# Patient Record
Sex: Male | Born: 1935 | Race: White | Hispanic: No | Marital: Married | Smoking: Never smoker
Health system: Southern US, Community
[De-identification: ages and names within clinical notes are randomized; demographics above are authoritative.]

## PROBLEM LIST (undated history)

## (undated) DIAGNOSIS — I10 Essential (primary) hypertension: Secondary | ICD-10-CM

## (undated) DIAGNOSIS — I4891 Unspecified atrial fibrillation: Secondary | ICD-10-CM

## (undated) DIAGNOSIS — F0390 Unspecified dementia without behavioral disturbance: Secondary | ICD-10-CM

## (undated) DIAGNOSIS — E785 Hyperlipidemia, unspecified: Secondary | ICD-10-CM

---

## 2010-10-29 ENCOUNTER — Ambulatory Visit (HOSPITAL_COMMUNITY)
Admission: RE | Admit: 2010-10-29 | Discharge: 2010-10-29 | Disposition: A | Discharge status: Home / Self Care | Payer: Medicare Other | Source: Ambulatory Visit | Attending: Family Medicine | Admitting: Family Medicine

## 2010-10-29 DIAGNOSIS — M79609 Pain in unspecified limb: Secondary | ICD-10-CM

## 2010-10-29 DIAGNOSIS — I4891 Unspecified atrial fibrillation: Secondary | ICD-10-CM | POA: Insufficient documentation

## 2010-10-29 DIAGNOSIS — M6281 Muscle weakness (generalized): Secondary | ICD-10-CM | POA: Insufficient documentation

## 2011-01-28 ENCOUNTER — Emergency Department (HOSPITAL_COMMUNITY)
Admission: EM | Admit: 2011-01-28 | Discharge: 2011-01-28 | Disposition: A | Discharge status: Home / Self Care | Payer: Medicare Other | Attending: Emergency Medicine | Admitting: Emergency Medicine

## 2011-01-28 ENCOUNTER — Emergency Department (HOSPITAL_COMMUNITY): Payer: Medicare Other

## 2011-01-28 DIAGNOSIS — Z79899 Other long term (current) drug therapy: Secondary | ICD-10-CM | POA: Insufficient documentation

## 2011-01-28 DIAGNOSIS — I2510 Atherosclerotic heart disease of native coronary artery without angina pectoris: Secondary | ICD-10-CM | POA: Insufficient documentation

## 2011-01-28 DIAGNOSIS — M25473 Effusion, unspecified ankle: Secondary | ICD-10-CM | POA: Insufficient documentation

## 2011-01-28 DIAGNOSIS — S9000XA Contusion of unspecified ankle, initial encounter: Secondary | ICD-10-CM | POA: Insufficient documentation

## 2011-01-28 DIAGNOSIS — M25476 Effusion, unspecified foot: Secondary | ICD-10-CM | POA: Insufficient documentation

## 2011-01-28 DIAGNOSIS — S8990XA Unspecified injury of unspecified lower leg, initial encounter: Secondary | ICD-10-CM | POA: Insufficient documentation

## 2011-01-28 DIAGNOSIS — E7800 Pure hypercholesterolemia, unspecified: Secondary | ICD-10-CM | POA: Insufficient documentation

## 2011-01-28 DIAGNOSIS — S8263XA Displaced fracture of lateral malleolus of unspecified fibula, initial encounter for closed fracture: Secondary | ICD-10-CM | POA: Insufficient documentation

## 2011-01-28 DIAGNOSIS — I10 Essential (primary) hypertension: Secondary | ICD-10-CM | POA: Insufficient documentation

## 2011-01-28 DIAGNOSIS — Z7901 Long term (current) use of anticoagulants: Secondary | ICD-10-CM | POA: Insufficient documentation

## 2011-01-28 DIAGNOSIS — M25579 Pain in unspecified ankle and joints of unspecified foot: Secondary | ICD-10-CM | POA: Insufficient documentation

## 2011-01-28 DIAGNOSIS — W010XXA Fall on same level from slipping, tripping and stumbling without subsequent striking against object, initial encounter: Secondary | ICD-10-CM | POA: Insufficient documentation

## 2016-01-13 ENCOUNTER — Emergency Department (HOSPITAL_COMMUNITY): Payer: Medicare Other

## 2016-01-13 ENCOUNTER — Inpatient Hospital Stay (HOSPITAL_COMMUNITY)
Admission: EM | Admit: 2016-01-13 | Discharge: 2016-01-17 | DRG: 392 | Disposition: A | Discharge status: Skilled Nursing Facility | Payer: Medicare Other | Attending: Internal Medicine | Admitting: Internal Medicine

## 2016-01-13 ENCOUNTER — Encounter (HOSPITAL_COMMUNITY): Payer: Self-pay | Admitting: Emergency Medicine

## 2016-01-13 DIAGNOSIS — R945 Abnormal results of liver function studies: Secondary | ICD-10-CM

## 2016-01-13 DIAGNOSIS — R000 Tachycardia, unspecified: Secondary | ICD-10-CM | POA: Diagnosis present

## 2016-01-13 DIAGNOSIS — R7401 Elevation of levels of liver transaminase levels: Secondary | ICD-10-CM | POA: Diagnosis present

## 2016-01-13 DIAGNOSIS — R112 Nausea with vomiting, unspecified: Secondary | ICD-10-CM | POA: Diagnosis not present

## 2016-01-13 DIAGNOSIS — E876 Hypokalemia: Secondary | ICD-10-CM | POA: Diagnosis not present

## 2016-01-13 DIAGNOSIS — R197 Diarrhea, unspecified: Secondary | ICD-10-CM | POA: Diagnosis present

## 2016-01-13 DIAGNOSIS — D72829 Elevated white blood cell count, unspecified: Secondary | ICD-10-CM | POA: Diagnosis present

## 2016-01-13 DIAGNOSIS — F0390 Unspecified dementia without behavioral disturbance: Secondary | ICD-10-CM | POA: Diagnosis present

## 2016-01-13 DIAGNOSIS — R7989 Other specified abnormal findings of blood chemistry: Secondary | ICD-10-CM

## 2016-01-13 DIAGNOSIS — Z79899 Other long term (current) drug therapy: Secondary | ICD-10-CM

## 2016-01-13 DIAGNOSIS — K8051 Calculus of bile duct without cholangitis or cholecystitis with obstruction: Secondary | ICD-10-CM | POA: Diagnosis present

## 2016-01-13 DIAGNOSIS — R740 Nonspecific elevation of levels of transaminase and lactic acid dehydrogenase [LDH]: Secondary | ICD-10-CM | POA: Diagnosis present

## 2016-01-13 HISTORY — DX: Hyperlipidemia, unspecified: E78.5

## 2016-01-13 HISTORY — DX: Essential (primary) hypertension: I10

## 2016-01-13 HISTORY — DX: Unspecified atrial fibrillation: I48.91

## 2016-01-13 HISTORY — DX: Unspecified dementia, unspecified severity, without behavioral disturbance, psychotic disturbance, mood disturbance, and anxiety: F03.90

## 2016-01-13 LAB — CBC WITH DIFFERENTIAL/PLATELET
BASOS ABS: 0 10*3/uL (ref 0.0–0.1)
BASOS PCT: 0 %
EOS ABS: 0 10*3/uL (ref 0.0–0.7)
Eosinophils Relative: 0 %
HCT: 36 % — ABNORMAL LOW (ref 39.0–52.0)
Hemoglobin: 12.8 g/dL — ABNORMAL LOW (ref 13.0–17.0)
Lymphocytes Relative: 4 %
Lymphs Abs: 0.6 10*3/uL — ABNORMAL LOW (ref 0.7–4.0)
MCH: 34.1 pg — ABNORMAL HIGH (ref 26.0–34.0)
MCHC: 35.6 g/dL (ref 30.0–36.0)
MCV: 96 fL (ref 78.0–100.0)
MONO ABS: 1.5 10*3/uL — AB (ref 0.1–1.0)
MONOS PCT: 9 %
Neutro Abs: 16 10*3/uL — ABNORMAL HIGH (ref 1.7–7.7)
Neutrophils Relative %: 87 %
PLATELETS: 165 10*3/uL (ref 150–400)
RBC: 3.75 MIL/uL — ABNORMAL LOW (ref 4.22–5.81)
RDW: 13.4 % (ref 11.5–15.5)
WBC: 18.1 10*3/uL — ABNORMAL HIGH (ref 4.0–10.5)

## 2016-01-13 LAB — COMPREHENSIVE METABOLIC PANEL
ALBUMIN: 3.6 g/dL (ref 3.5–5.0)
ALT: 509 U/L — ABNORMAL HIGH (ref 17–63)
ANION GAP: 8 (ref 5–15)
AST: 503 U/L — ABNORMAL HIGH (ref 15–41)
Alkaline Phosphatase: 114 U/L (ref 38–126)
BUN: 20 mg/dL (ref 6–20)
CALCIUM: 9 mg/dL (ref 8.9–10.3)
CHLORIDE: 107 mmol/L (ref 101–111)
CO2: 25 mmol/L (ref 22–32)
Creatinine, Ser: 0.87 mg/dL (ref 0.61–1.24)
GFR calc Af Amer: >60 mL/min (ref >60)
GFR calc non Af Amer: >60 mL/min (ref >60)
GLUCOSE: 134 mg/dL — AB (ref 65–99)
POTASSIUM: 3.6 mmol/L (ref 3.5–5.1)
SODIUM: 140 mmol/L (ref 135–145)
Total Bilirubin: 4.7 mg/dL — ABNORMAL HIGH (ref 0.3–1.2)
Total Protein: 7.4 g/dL (ref 6.5–8.1)

## 2016-01-13 LAB — URINALYSIS, ROUTINE W REFLEX MICROSCOPIC
Glucose, UA: NEGATIVE mg/dL
HGB URINE DIPSTICK: NEGATIVE
Ketones, ur: NEGATIVE mg/dL
Nitrite: NEGATIVE
PROTEIN: 30 mg/dL — AB
Specific Gravity, Urine: 1.021 (ref 1.005–1.030)
pH: 6.5 (ref 5.0–8.0)

## 2016-01-13 LAB — LIPASE, BLOOD: LIPASE: 47 U/L (ref 11–51)

## 2016-01-13 LAB — URINE MICROSCOPIC-ADD ON
Bacteria, UA: NONE SEEN
Squamous Epithelial / LPF: NONE SEEN

## 2016-01-13 LAB — I-STAT CG4 LACTIC ACID, ED: LACTIC ACID, VENOUS: 1.83 mmol/L (ref 0.5–1.9)

## 2016-01-13 MED ORDER — DIATRIZOATE MEGLUMINE & SODIUM 66-10 % PO SOLN
15.0000 mL | Freq: Once | ORAL | Status: AC
  Administered 2016-01-13: 30 mL via ORAL

## 2016-01-13 MED ORDER — ONDANSETRON HCL 4 MG/2ML IJ SOLN: 4.0000 mg | Freq: Once | INTRAMUSCULAR | Status: DC

## 2016-01-13 MED ORDER — SODIUM CHLORIDE 0.9 % IV BOLUS (SEPSIS)
1000.0000 mL | Freq: Once | INTRAVENOUS | Status: AC
  Administered 2016-01-13: 1000 mL via INTRAVENOUS

## 2016-01-13 NOTE — ED Provider Notes (Signed)
[facility]-EMERGENCY DEPT Provider Note   CSN: [id number] Arrival date & time: 01/13/16  2042  By signing my name below, I, Rosario AdieWilliam Andrew Hiatt, attest that this documentation has been prepared under the direction and in the presence of TRW AutomotiveKelly Dellamae Rosamilia, PA-C.  Electronically Signed: Rosario AdieWilliam Andrew Hiatt, ED Scribe. 01/13/16. 9:32 PM.  History   Chief Complaint Chief Complaint  Patient presents with  . Nausea  . Emesis   The history is provided by the patient and the EMS personnel. The history is limited by the condition of the patient. No language interpreter was used.   LEVEL 5 CAVEAT: HPI and ROS limited due to dementia.  HPI Comments: Philip Morse is a 82 y.o. male BIB EMS from an independent living facility, with a PMHx of dementia, who presents to the Emergency Department complaining of intermittent episodes of nausea with associated emesis onset this AM prior to arrival. Pt denies nausea while in the ED. He reports associated abdominal distension yesterday. Denies current abdominal pain, chest pain, or nausea.  Past Medical History:  Diagnosis Date  . Dementia    There are no active problems to display for this patient.  History reviewed. No pertinent surgical history.  Home Medications    Prior to Admission medications   Medication Sig Start Date End Date Taking? Authorizing Provider  atorvastatin (LIPITOR) 40 MG tablet Take 40 mg by mouth daily.   Yes Historical Provider, MD  hydrochlorothiazide (MICROZIDE) 12.5 MG capsule Take 12.5 mg by mouth daily.   Yes Historical Provider, MD  levothyroxine (SYNTHROID, LEVOTHROID) 112 MCG tablet Take 112 mcg by mouth daily before breakfast.   Yes Historical Provider, MD  memantine (NAMENDA) 10 MG tablet Take 10 mg by mouth 2 (two) times daily.   Yes Historical Provider, MD  metoprolol succinate (TOPROL-XL) 50 MG 24 hr tablet Take 50 mg by mouth daily. Take with or immediately following a meal.   Yes Historical Provider, MD  rivaroxaban  (XARELTO) 20 MG TABS tablet Take 20 mg by mouth daily with supper.   Yes Historical Provider, MD   Family History History reviewed. No pertinent family history.  Social History Social History  Substance Use Topics  . Smoking status: Never Smoker  . Smokeless tobacco: Never Used  . Alcohol use No   Allergies   Review of patient's allergies indicates no known allergies.  Review of Systems Review of Systems  Unable to perform ROS: Dementia  Cardiovascular: Negative for chest pain.  Gastrointestinal: Positive for abdominal distention, nausea and vomiting. Negative for abdominal pain.    Physical Exam Updated Vital Signs BP 110/93   Pulse 106   Temp 98.7 F (37.1 C) (Oral)   Resp 21   Ht 6' (1.829 m)   Wt 99.8 kg   SpO2 98%   BMI 29.84 kg/m   Physical Exam  Constitutional: He is oriented to person, place, and time. He appears well-developed and well-nourished. No distress.  Nontoxic appearing and in NAD  HENT:  Head: Normocephalic and atraumatic.  Eyes: Conjunctivae and EOM are normal. No scleral icterus.  Neck: Normal range of motion.  Cardiovascular: Regular rhythm and intact distal pulses.   Tachycardia  Pulmonary/Chest: Effort normal. No respiratory distress. He has no wheezes. He has no rales.  Respirations even and unlabored. Lungs CTAB.  Abdominal: Soft. He exhibits no mass. There is no guarding.  Soft abdomen with mild TTP in RLQ and LLQ. No masses. No peritoneal signs. No rigidity.  Musculoskeletal: Normal range of motion.  Neurological: He is alert and oriented to person, place, and time.  GCS 15. Patient moving all extremities.  Skin: Skin is warm and dry. No rash noted. He is not diaphoretic. No erythema. No pallor.  Psychiatric: He has a normal mood and affect. His behavior is normal.  Nursing note and vitals reviewed.  ED Treatments / Results  DIAGNOSTIC STUDIES: Oxygen Saturation is 92% on RA, low by my interpretation.   COORDINATION OF  CARE: 9:31 PM-Discussed next steps with pt. Pt verbalized understanding and is agreeable with the plan.   Labs (all labs ordered are listed, but only abnormal results are displayed) Labs Reviewed  CBC WITH DIFFERENTIAL/PLATELET - Abnormal; Notable for the following:       Result Value   WBC 18.1 (*)    RBC 3.75 (*)    Hemoglobin 12.8 (*)    HCT 36.0 (*)    MCH 34.1 (*)    Neutro Abs 16.0 (*)    Lymphs Abs 0.6 (*)    Monocytes Absolute 1.5 (*)    All other components within normal limits  COMPREHENSIVE METABOLIC PANEL - Abnormal; Notable for the following:    Glucose, Bld 134 (*)    AST 503 (*)    ALT 509 (*)    Total Bilirubin 4.7 (*)    All other components within normal limits  URINALYSIS, ROUTINE W REFLEX MICROSCOPIC (NOT AT [facility]) - Abnormal; Notable for the following:    Color, Urine ORANGE (*)    Bilirubin Urine MODERATE (*)    Protein, ur 30 (*)    Leukocytes, UA TRACE (*)    All other components within normal limits  LIPASE, BLOOD  URINE MICROSCOPIC-ADD ON  I-STAT CG4 LACTIC ACID, ED    EKG  EKG Interpretation None      Radiology Ct Abdomen Pelvis W Contrast  Addendum Date: 01/14/2016   ADDENDUM REPORT: 01/14/2016 01:09 ADDENDUM: 17 mm left lower lobe subpleural nodule. Consider one of the following in 3 months for both low-risk and high-risk individuals: (a) repeat chest CT, (b) follow-up PET-CT, or (c) tissue sampling. This recommendation follows the consensus statement: Guidelines for Management of Incidental Pulmonary Nodules Detected on CT Images:From the Fleischner Society 2017; published online before print (10.1148/[id number]) [id number]). Electronically Signed   By: Mitzi HansenLance  Furusawa-Stratton M.D.   On: 01/14/2016 01:09   Result Date: 01/14/2016 CLINICAL DATA:  82 y/o M; abdominal distention with nausea and vomiting for 1 day. Leukocytosis. EXAM: CT ABDOMEN AND PELVIS WITH CONTRAST TECHNIQUE: Multidetector CT imaging of the abdomen and pelvis was performed using  the standard protocol following bolus administration of intravenous contrast. CONTRAST:  100 cc Isovue-300. COMPARISON:  Abdominal ultrasound dated 01/13/2016. FINDINGS: Lower chest: 17 mm left lung base subpleural nodule (series 5, image 34). Small hiatal hernia. Hepatobiliary: No mass. Segment 7/8 subcentimeter lucency is probably a cyst. Probable steatosis. No biliary ductal dilatation. Pancreas: No mass, inflammatory changes, or other significant abnormality. Spleen: Within normal limits in size and appearance. Adrenals/Urinary Tract: Bilateral kidney cysts the largest in the right interpolar region measuring 18 mm. Left kidney lower pole punctate density is probably a nonobstructing stone. Normal adrenal glands. No hydronephrosis. Small anterior bladder diverticulum. Stomach/Bowel: Small hiatal hernia. No evidence of bowel obstruction or inflammation. Moderate sigmoid diverticulosis without evidence of diverticulitis. Vascular/Lymphatic: No lymphadenopathy. Severe calcific atherosclerosis of the abdominal aorta and bilateral common iliac arteries. Reproductive: Moderate prostate enlargement with calcifications. Other: None. Musculoskeletal: The bones are demineralized. Extensive degenerative changes of the lumbar spine with  multilevel severe disc space narrowing from L1-L4, endplate degenerative changes, and marginal osteophytes. Osteophyte ridging results in severe canal stenosis at the L1-2 level. No acute fracture is identified. IMPRESSION: 1. No acute process of the abdomen or pelvis is identified. 2. Probable hepatic steatosis. 3. Small hiatal hernia. 4. Left kidney lower pole punctate nonobstructing stone. No hydronephrosis. 5. Moderate sigmoid diverticulosis without evidence for diverticulitis. 6. Severe calcific atherosclerosis of the abdominal aorta. 7. Extensive degenerative changes of the lumbar spine greatest at L1-2 where there is severe bony canal stenosis. Electronically Signed: By: Mitzi Hansen M.D. On: 01/14/2016 01:05   US Abdomen Limited  Result Date: 01/14/2016 CLINICAL DATA:  81 y/o M; right upper quadrant pain nausea and vomiting since this morning. EXAM: US ABDOMEN LIMITED - RIGHT UPPER QUADRANT COMPARISON:  None. FINDINGS: Gallbladder: No gallstones or wall thickening visualized. No sonographic Murphy sign noted by sonographer. Echogenic focus along the wall of gallbladder may represent a polyp or nonmobile stone measuring up to 4.8 mm. Common bile duct: Diameter: 4.1 mm. Liver: No focal lesion identified. Diffusely increased echogenicity compatible with steatosis. IMPRESSION: 1. No evidence for cholecystitis. 2. 5 mm echogenic gallbladder wall focus may represent a polyp or nonmobile stone. 3. Hepatic steatosis. Electronically Signed   By: Mitzi Hansen M.D.   On: 01/14/2016 00:28    Procedures Procedures   Medications Ordered in ED Medications  ondansetron [facility]) injection 4 mg (0 mg Intravenous Hold 01/13/16 2201)  sodium chloride 0.9 % bolus 1,000 mL (0 mLs Intravenous Stopped 01/13/16 2327)  diatrizoate meglumine-sodium (GASTROGRAFIN) 66-10 % solution 15 mL (30 mLs Oral Given 01/13/16 2227)  sodium chloride 0.9 % bolus 1,000 mL (0 mLs Intravenous Stopped 01/14/16 0018)  iopamidol (ISOVUE-300) 61 % injection 100 mL (100 mLs Intravenous Contrast Given 01/14/16 0034)    Initial Impression / Assessment and Plan / ED Course  I have reviewed the triage vital signs and the nursing notes.  Pertinent labs & imaging results that were available during my care of the patient were reviewed by me and considered in my medical decision making (see chart for details).  Clinical Course    Patient presenting for nausea vomiting. He was tachycardic on arrival which improved with IVF. He has leukocytosis and increased LFTs. TTP on exam by my attending appreciated to be in the RUQ. No hx of fevers. No jaundice. Less concerning for cholangitis at this time;  however, plan to admit for further evaluation of symptoms and lab abnormalities. Case discussed with Dr. Julian Reil of [facility] who will admit. Dr. Corlis Leak to discuss with GI.   Final Clinical Impressions(s) / ED Diagnoses   Final diagnoses:  Non-intractable vomiting with nausea, vomiting of unspecified type  Elevated LFTs  Leukocytosis    New Prescriptions New Prescriptions   No medications on file    I personally performed the services described in this documentation, which was scribed in my presence. The recorded information has been reviewed and is accurate.       Antony Madura, PA-C 01/14/16 1610    Courteney Randall An, MD 01/14/16 [phone]

## 2016-01-13 NOTE — ED Notes (Signed)
Patient and family was told that they are waiting on their CT scan.

## 2016-01-13 NOTE — ED Notes (Signed)
Bed: [id number] Expected date:  Expected time:  Means of arrival:  Comments: Fever, sinus tach

## 2016-01-13 NOTE — ED Triage Notes (Addendum)
Patient complaining of nausea and vomiting that started this morning. Per EMS patient has positive Orthostatics. Patient stood up and was dizzy. Patient got 500 ml of NS. On 2L [place] for an O2 Sat of 92%. Patient came from Independent living off of [address]

## 2016-01-13 NOTE — Progress Notes (Signed)
EDCM consulted to assist in finding where patient lives. EDCM googled [address] and found [facility] [phone].   EDCM called [facility] and spoke to  CarMaxKatie RN, Charity fundraiserN from skilled section of facility, who reports patient is from their Independent apartment section. Katie provided patient's wife's phone number as [phone].   Katie could not tell EDCM who patient's pcp is. Katie reports she believes the patient is able to ambulate with a walker. She doesn't know the patient to be confused. Katie reports the coordinator for the Independent Living section is Arletha PiliMary Hopper [phone].  She reports Ms. Alwyn RenHopper likes to be called if anything happens to any of the residents.   No further EDCM needs at this time.

## 2016-01-14 ENCOUNTER — Encounter (HOSPITAL_COMMUNITY): Payer: Self-pay | Admitting: Radiology

## 2016-01-14 DIAGNOSIS — R7401 Elevation of levels of liver transaminase levels: Secondary | ICD-10-CM | POA: Diagnosis present

## 2016-01-14 DIAGNOSIS — R740 Nonspecific elevation of levels of transaminase and lactic acid dehydrogenase [LDH]: Secondary | ICD-10-CM | POA: Diagnosis present

## 2016-01-14 DIAGNOSIS — E876 Hypokalemia: Secondary | ICD-10-CM | POA: Diagnosis not present

## 2016-01-14 DIAGNOSIS — R197 Diarrhea, unspecified: Secondary | ICD-10-CM | POA: Diagnosis present

## 2016-01-14 DIAGNOSIS — R000 Tachycardia, unspecified: Secondary | ICD-10-CM | POA: Diagnosis present

## 2016-01-14 DIAGNOSIS — Z79899 Other long term (current) drug therapy: Secondary | ICD-10-CM | POA: Diagnosis not present

## 2016-01-14 DIAGNOSIS — F0390 Unspecified dementia without behavioral disturbance: Secondary | ICD-10-CM | POA: Diagnosis present

## 2016-01-14 DIAGNOSIS — R112 Nausea with vomiting, unspecified: Secondary | ICD-10-CM | POA: Diagnosis present

## 2016-01-14 DIAGNOSIS — K8051 Calculus of bile duct without cholangitis or cholecystitis with obstruction: Secondary | ICD-10-CM | POA: Diagnosis present

## 2016-01-14 DIAGNOSIS — D72829 Elevated white blood cell count, unspecified: Secondary | ICD-10-CM | POA: Diagnosis present

## 2016-01-14 LAB — COMPREHENSIVE METABOLIC PANEL
ALT: 375 U/L — AB (ref 17–63)
AST: 323 U/L — AB (ref 15–41)
Albumin: 3.2 g/dL — ABNORMAL LOW (ref 3.5–5.0)
Alkaline Phosphatase: 100 U/L (ref 38–126)
Anion gap: 5 (ref 5–15)
BILIRUBIN TOTAL: 4.6 mg/dL — AB (ref 0.3–1.2)
BUN: 16 mg/dL (ref 6–20)
CALCIUM: 8.3 mg/dL — AB (ref 8.9–10.3)
CO2: 25 mmol/L (ref 22–32)
CREATININE: 0.79 mg/dL (ref 0.61–1.24)
Chloride: 108 mmol/L (ref 101–111)
GFR calc Af Amer: >60 mL/min (ref >60)
Glucose, Bld: 104 mg/dL — ABNORMAL HIGH (ref 65–99)
Potassium: 3.4 mmol/L — ABNORMAL LOW (ref 3.5–5.1)
Sodium: 138 mmol/L (ref 135–145)
Total Protein: 6.6 g/dL (ref 6.5–8.1)

## 2016-01-14 LAB — CBC
HEMATOCRIT: 32.6 % — AB (ref 39.0–52.0)
Hemoglobin: 11.1 g/dL — ABNORMAL LOW (ref 13.0–17.0)
MCH: 33.7 pg (ref 26.0–34.0)
MCHC: 34 g/dL (ref 30.0–36.0)
MCV: 99.1 fL (ref 78.0–100.0)
Platelets: 158 10*3/uL (ref 150–400)
RBC: 3.29 MIL/uL — AB (ref 4.22–5.81)
RDW: 13.8 % (ref 11.5–15.5)
WBC: 12.4 10*3/uL — AB (ref 4.0–10.5)

## 2016-01-14 MED ORDER — SODIUM CHLORIDE 0.9 % IV SOLN
INTRAVENOUS | Status: DC
  Administered 2016-01-14: 06:00:00 via INTRAVENOUS
  Administered 2016-01-14: 125 mL/h via INTRAVENOUS
  Administered 2016-01-14 – 2016-01-17 (×7): via INTRAVENOUS

## 2016-01-14 MED ORDER — MEMANTINE HCL 10 MG PO TABS
10.0000 mg | ORAL_TABLET | Freq: Two times a day (BID) | ORAL | Status: DC
  Administered 2016-01-14 – 2016-01-17 (×8): 10 mg via ORAL
  Filled 2016-01-14 (×8): qty 1

## 2016-01-14 MED ORDER — IOPAMIDOL (ISOVUE-300) INJECTION 61%
100.0000 mL | Freq: Once | INTRAVENOUS | Status: AC | PRN
Start: 2016-01-14 — End: 2016-01-14
  Administered 2016-01-14: 100 mL via INTRAVENOUS

## 2016-01-14 MED ORDER — LEVOTHYROXINE SODIUM 112 MCG PO TABS
112.0000 ug | ORAL_TABLET | Freq: Every day | ORAL | Status: DC
  Administered 2016-01-14 – 2016-01-17 (×4): 112 ug via ORAL
  Filled 2016-01-14 (×6): qty 1

## 2016-01-14 MED ORDER — ATORVASTATIN CALCIUM 40 MG PO TABS
40.0000 mg | ORAL_TABLET | Freq: Every day | ORAL | Status: DC
  Administered 2016-01-14 – 2016-01-17 (×4): 40 mg via ORAL
  Filled 2016-01-14 (×4): qty 1

## 2016-01-14 MED ORDER — ONDANSETRON HCL 4 MG/2ML IJ SOLN: 4.0000 mg | Freq: Four times a day (QID) | INTRAMUSCULAR | Status: DC | PRN

## 2016-01-14 MED ORDER — SODIUM CHLORIDE 0.9 % IV SOLN
Freq: Once | INTRAVENOUS | Status: AC
  Administered 2016-01-14: 75 mL/h via INTRAVENOUS

## 2016-01-14 MED ORDER — SODIUM CHLORIDE 0.9 % IV SOLN
3.0000 g | Freq: Four times a day (QID) | INTRAVENOUS | Status: DC
  Administered 2016-01-14 – 2016-01-17 (×14): 3 g via INTRAVENOUS
  Filled 2016-01-14 (×15): qty 3

## 2016-01-14 MED ORDER — ENOXAPARIN SODIUM 40 MG/0.4ML ~~LOC~~ SOLN
40.0000 mg | SUBCUTANEOUS | Status: DC
  Administered 2016-01-14 – 2016-01-17 (×4): 40 mg via SUBCUTANEOUS
  Filled 2016-01-14 (×4): qty 0.4

## 2016-01-14 NOTE — Progress Notes (Signed)
Patient seen and evaluated earlier this AM by my associate. Please refer to H and P for details regarding assessment and plan. Liver enzymes trending down currently.  Pt has no new complaints.  Gen: Pt in nad, alert and awake CV: no cyanosis Pulm: no increased wob  Will reassess next am.  Penny PiaVEGA, Ralonda Tartt

## 2016-01-14 NOTE — Clinical Social Work Note (Signed)
Clinical Social Work Assessment  Patient Details  Name: Philip Morse MRN: [id number] Date of Birth: 11-26-1925  Date of referral:  01/14/16               Reason for consult:  Facility Placement                Permission sought to share information with:  Family Supports, Customer service manager Permission granted to share information::  Yes, Verbal Permission Granted  Name::        Agency::     Relationship::  Wife  Contact Information:   [phone]  Housing/Transportation Living arrangements for the past 2 months:  [place] of Information:  Patient, Adult Children Patient Interpreter Needed:  None Criminal Activity/Legal Involvement Pertinent to Current Situation/Hospitalization:  No - Comment as needed Significant Relationships:  Adult Children, Spouse, Community Support Lives with:  Spouse, Other (Comment) ([place]) Do you feel safe going back to the place where you live?  Yes Need for family participation in patient care:  Yes (Comment)  Care giving concerns:  LCSWA met with patient at bedside, patient recepeive and pleasant. Patient reports he is from  [facility]. Patient felt more comfortable with [place] talking with his wife or daughters "they will give you the right information." LCSWA contacted patient wife Philip Morse, wrong number listed on facesheet. LCSWA contacted patient daughter Philip Morse. She provide LCSWA with updated number. She confirms patient lives at independent living but is hopeful [facility] his ambulation improves.  She reports the patient weak and unresponsive at home  "he was not himself ", it has been difficult for his spouse to help and support him alone.  Social Worker assessment / plan: Patient and family agreeable to pending disposition to SNF. LCSWA explained to family the patient will need to be seen by physical  therapy, and the patient will need three night qualifying stay for medicare to authorize and pay for rehab.  Plan: Assist patient and family with disposition to SNF.  Employment status:  Retired Forensic scientist:  Medicare PT Recommendations:  Not assessed at this time Information / Referral to community resources:     Patient/Family's Response to care:  Agreeable.  Patient/Family's Understanding of and Emotional Response to Diagnosis, Current Treatment, and Prognosis:  "We are so thankful he got the antibiotics and and fluids he needed." Patient family is involved in patient care and supportive of patient going to rehab at this time.   Emotional Assessment Appearance:  Appears stated age Attitude/Demeanor/Rapport:    Affect (typically observed):  Pleasant, Stable, Calm Orientation:  Oriented to Self, Oriented to Place Alcohol / Substance use:  Not Applicable Psych involvement (Current and /or in the community):  No (Comment)  Discharge Needs  Concerns to be addressed:  Care Coordination Readmission within the last 30 days:    Current discharge risk:  Dependent with Mobility Barriers to Discharge:  Continued Medical Work up, [place], LCSW 01/14/2016, 3:24 PM

## 2016-01-14 NOTE — ED Notes (Signed)
Patient in CT

## 2016-01-14 NOTE — H&P (Signed)
History and Physical    Philip Morse RUE:[id number] DOB: 1925/10/26 DOA: 01/13/2016   PCP: No primary care provider on file. Chief Complaint:  Chief Complaint  Patient presents with  . Nausea  . Emesis    HPI: Philip Morse is a 82 y.o. male with medical history significant of Dementia.  Patient presents to the ED with c/o N/V/D, RUQ abd pain.  Symptoms onset in the AM yesterday.  Persistent since onset.  Nothing makes better or worse.  ED Course: CT and US unremarkable except for a gallbladder polyp vs non-mobile stone.  Leukocytosis, mild tachycardia that improves with IVF.  Review of Systems: As per HPI otherwise 10 point review of systems negative.    Past Medical History:  Diagnosis Date  . Dementia     History reviewed. No pertinent surgical history.   reports that he has never smoked. He has never used smokeless tobacco. He reports that he does not drink alcohol or use drugs.  No Known Allergies  History reviewed. No pertinent family history. No known sick contacts, remainder of family is not ill.   Prior to Admission medications   Medication Sig Start Date End Date Taking? Authorizing Provider  atorvastatin (LIPITOR) 40 MG tablet Take 40 mg by mouth daily.   Yes Historical Provider, MD  hydrochlorothiazide (MICROZIDE) 12.5 MG capsule Take 12.5 mg by mouth daily.   Yes Historical Provider, MD  levothyroxine (SYNTHROID, LEVOTHROID) 112 MCG tablet Take 112 mcg by mouth daily before breakfast.   Yes Historical Provider, MD  memantine (NAMENDA) 10 MG tablet Take 10 mg by mouth 2 (two) times daily.   Yes Historical Provider, MD  metoprolol succinate (TOPROL-XL) 50 MG 24 hr tablet Take 50 mg by mouth daily. Take with or immediately following a meal.   Yes Historical Provider, MD  rivaroxaban (XARELTO) 20 MG TABS tablet Take 20 mg by mouth daily with supper.   Yes Historical Provider, MD    Physical Exam: Vitals:   01/13/16 2330 01/13/16 2345 01/14/16 0015 01/14/16 0203    BP: 110/59 121/63 110/93 (!) 140/126  Pulse: 89 109 106 98  Resp: 21 24 21 20   Temp:      TempSrc:      SpO2: 96% (!) 88% 98% 98%  Weight:      Height:          Constitutional: NAD, calm, comfortable Eyes: PERRL, lids and conjunctivae normal ENMT: Mucous membranes are moist. Posterior pharynx clear of any exudate or lesions.Normal dentition.  Neck: normal, supple, no masses, no thyromegaly Respiratory: clear to auscultation bilaterally, no wheezing, no crackles. Normal respiratory effort. No accessory muscle use.  Cardiovascular: Regular rate and rhythm, no murmurs / rubs / gallops. No extremity edema. 2+ pedal pulses. No carotid bruits.  Abdomen: no tenderness, no masses palpated. No hepatosplenomegaly. Bowel sounds positive.  Musculoskeletal: no clubbing / cyanosis. No joint deformity upper and lower extremities. Good ROM, no contractures. Normal muscle tone.  Skin: no rashes, lesions, ulcers. No induration Neurologic: CN 2-12 grossly intact. Sensation intact, DTR normal. Strength 5/5 in all 4.  Psychiatric: Normal judgment and insight. Alert and oriented x 3. Normal mood.    Labs on Admission: I have personally reviewed following labs and imaging studies  CBC:  Recent Labs Lab 01/13/16 2140  WBC 18.1*  NEUTROABS 16.0*  HGB 12.8*  HCT 36.0*  MCV 96.0  PLT 165   Basic Metabolic Panel:  Recent Labs Lab 01/13/16 2140  NA 140  K 3.6  CL 107  CO2 25  GLUCOSE 134*  BUN 20  CREATININE 0.87  CALCIUM 9.0   GFR: Estimated Creatinine Clearance: 71.8 mL/min (by C-G formula based on SCr of 0.87 mg/dL). Liver Function Tests:  Recent Labs Lab 01/13/16 2140  AST 503*  ALT 509*  ALKPHOS 114  BILITOT 4.7*  PROT 7.4  ALBUMIN 3.6    Recent Labs Lab 01/13/16 2140  LIPASE 47   No results for input(s): AMMONIA in the last 168 hours. Coagulation Profile: No results for input(s): INR, PROTIME in the last 168 hours. Cardiac Enzymes: No results for input(s):  CKTOTAL, CKMB, CKMBINDEX, TROPONINI in the last 168 hours. BNP (last 3 results) No results for input(s): PROBNP in the last 8760 hours. HbA1C: No results for input(s): HGBA1C in the last 72 hours. CBG: No results for input(s): GLUCAP in the last 168 hours. Lipid Profile: No results for input(s): CHOL, HDL, LDLCALC, TRIG, CHOLHDL, LDLDIRECT in the last 72 hours. Thyroid Function Tests: No results for input(s): TSH, T4TOTAL, FREET4, T3FREE, THYROIDAB in the last 72 hours. Anemia Panel: No results for input(s): VITAMINB12, FOLATE, FERRITIN, TIBC, IRON, RETICCTPCT in the last 72 hours. Urine analysis:    Component Value Date/Time   COLORURINE ORANGE (A) 01/13/2016 2215   APPEARANCEUR CLEAR 01/13/2016 2215   LABSPEC 1.021 01/13/2016 2215   PHURINE 6.5 01/13/2016 2215   GLUCOSEU NEGATIVE 01/13/2016 2215   HGBUR NEGATIVE 01/13/2016 2215   BILIRUBINUR MODERATE (A) 01/13/2016 2215   KETONESUR NEGATIVE 01/13/2016 2215   PROTEINUR 30 (A) 01/13/2016 2215   NITRITE NEGATIVE 01/13/2016 2215   LEUKOCYTESUR TRACE (A) 01/13/2016 2215   Sepsis Labs: @LABRCNTIP (procalcitonin:4,lacticidven:4) )No results found for this or any previous visit (from the past 240 hour(s)).   Radiological Exams on Admission: Ct Abdomen Pelvis W Contrast  Addendum Date: 01/14/2016   ADDENDUM REPORT: 01/14/2016 01:09 ADDENDUM: 17 mm left lower lobe subpleural nodule. Consider one of the following in 3 months for both low-risk and high-risk individuals: (a) repeat chest CT, (b) follow-up PET-CT, or (c) tissue sampling. This recommendation follows the consensus statement: Guidelines for Management of Incidental Pulmonary Nodules Detected on CT Images:From the Fleischner Society 2017; published online before print (10.1148/radiol.[id number]). Electronically Signed   By: Mitzi HansenLance  Furusawa-Stratton M.D.   On: 01/14/2016 01:09   Result Date: 01/14/2016 CLINICAL DATA:  82 y/o M; abdominal distention with nausea and vomiting for 1  day. Leukocytosis. EXAM: CT ABDOMEN AND PELVIS WITH CONTRAST TECHNIQUE: Multidetector CT imaging of the abdomen and pelvis was performed using the standard protocol following bolus administration of intravenous contrast. CONTRAST:  100 cc Isovue-300. COMPARISON:  Abdominal ultrasound dated 01/13/2016. FINDINGS: Lower chest: 17 mm left lung base subpleural nodule (series 5, image 34). Small hiatal hernia. Hepatobiliary: No mass. Segment 7/8 subcentimeter lucency is probably a cyst. Probable steatosis. No biliary ductal dilatation. Pancreas: No mass, inflammatory changes, or other significant abnormality. Spleen: Within normal limits in size and appearance. Adrenals/Urinary Tract: Bilateral kidney cysts the largest in the right interpolar region measuring 18 mm. Left kidney lower pole punctate density is probably a nonobstructing stone. Normal adrenal glands. No hydronephrosis. Small anterior bladder diverticulum. Stomach/Bowel: Small hiatal hernia. No evidence of bowel obstruction or inflammation. Moderate sigmoid diverticulosis without evidence of diverticulitis. Vascular/Lymphatic: No lymphadenopathy. Severe calcific atherosclerosis of the abdominal aorta and bilateral common iliac arteries. Reproductive: Moderate prostate enlargement with calcifications. Other: None. Musculoskeletal: The bones are demineralized. Extensive degenerative changes of the lumbar spine with multilevel severe disc space narrowing from L1-L4, endplate  degenerative changes, and marginal osteophytes. Osteophyte ridging results in severe canal stenosis at the L1-2 level. No acute fracture is identified. IMPRESSION: 1. No acute process of the abdomen or pelvis is identified. 2. Probable hepatic steatosis. 3. Small hiatal hernia. 4. Left kidney lower pole punctate nonobstructing stone. No hydronephrosis. 5. Moderate sigmoid diverticulosis without evidence for diverticulitis. 6. Severe calcific atherosclerosis of the abdominal aorta. 7.  Extensive degenerative changes of the lumbar spine greatest at L1-2 where there is severe bony canal stenosis. Electronically Signed: By: Mitzi HansenLance  Furusawa-Stratton M.D. On: 01/14/2016 01:05   [place] Abdomen Limited  Result Date: 01/14/2016 CLINICAL DATA:  82 y/o M; right upper quadrant pain nausea and vomiting since this morning. EXAM: US ABDOMEN LIMITED - RIGHT UPPER QUADRANT COMPARISON:  None. FINDINGS: Gallbladder: No gallstones or wall thickening visualized. No sonographic Murphy sign noted by sonographer. Echogenic focus along the wall of gallbladder may represent a polyp or nonmobile stone measuring up to 4.8 mm. Common bile duct: Diameter: 4.1 mm. Liver: No focal lesion identified. Diffusely increased echogenicity compatible with steatosis. IMPRESSION: 1. No evidence for cholecystitis. 2. 5 mm echogenic gallbladder wall focus may represent a polyp or nonmobile stone. 3. Hepatic steatosis. Electronically Signed   By: Mitzi HansenLance  Furusawa-Stratton M.D.   On: 01/14/2016 00:28    EKG: Independently reviewed.  Assessment/Plan Principal Problem:   Transaminitis Active Problems:   Leukocytosis   Nausea vomiting and diarrhea    1. Transaminitis, N/V/D, leukocytosis - 1. Repeat CMP and CBC in AM 2. IVF 3. Empiric unasyn for now though ascending cholangitis seems less likely given negative imaging studies 4. PRN zofran   DVT prophylaxis: Lovenox Code Status: Full Family Communication: Family at bedside Consults called: None Admission status: Admit to inpatient   Hillary BowGARDNER, JARED M. DO [facility] Pager [phone] from 7PM-7AM  If 7AM-7PM, please contact the day physician for the patient [email or web address] Password [email or web address]  01/14/2016, 2:09 AM

## 2016-01-14 NOTE — Progress Notes (Signed)
Pharmacy Antibiotic Note  Philip Morse is a 82 y.o. male admitted on 01/13/2016 with Intra-abdominal infection.  Pharmacy has been consulted for Unasyn dosing.  Plan: Unasyn 3gm iv q6hr  Height: 6' (182.9 cm) Weight: 234 lb 9.1 oz (106.4 kg) IBW/kg (Calculated) : 77.6  Temp (24hrs), Avg:98.5 F (36.9 C), Min:98.2 F (36.8 C), Max:98.7 F (37.1 C)   Recent Labs Lab 01/13/16 2140 01/13/16 2149  WBC 18.1*  --   CREATININE 0.87  --   LATICACIDVEN  --  1.83    Estimated Creatinine Clearance: 74 mL/min (by C-G formula based on SCr of 0.87 mg/dL).    No Known Allergies  Antimicrobials this admission: Unasyn 8/21 >>  Dose adjustments this admission: -  Microbiology results: pending  Thank you for allowing pharmacy to be a part of this patient's care.  Philip DavidsonGrimsley Morse, Philip Morse 01/14/2016 5:42 AM

## 2016-01-14 NOTE — ED Notes (Signed)
Patient is on room air 

## 2016-01-15 DIAGNOSIS — R197 Diarrhea, unspecified: Secondary | ICD-10-CM

## 2016-01-15 DIAGNOSIS — R740 Nonspecific elevation of levels of transaminase and lactic acid dehydrogenase [LDH]: Secondary | ICD-10-CM

## 2016-01-15 DIAGNOSIS — D72829 Elevated white blood cell count, unspecified: Secondary | ICD-10-CM

## 2016-01-15 DIAGNOSIS — R112 Nausea with vomiting, unspecified: Principal | ICD-10-CM

## 2016-01-15 LAB — COMPREHENSIVE METABOLIC PANEL
ALT: 269 U/L — AB (ref 17–63)
AST: 167 U/L — AB (ref 15–41)
Albumin: 3.6 g/dL (ref 3.5–5.0)
Alkaline Phosphatase: 143 U/L — ABNORMAL HIGH (ref 38–126)
Anion gap: 7 (ref 5–15)
BUN: 12 mg/dL (ref 6–20)
CALCIUM: 8.7 mg/dL — AB (ref 8.9–10.3)
CHLORIDE: 106 mmol/L (ref 101–111)
CO2: 25 mmol/L (ref 22–32)
Creatinine, Ser: 0.82 mg/dL (ref 0.61–1.24)
GFR calc non Af Amer: >60 mL/min (ref >60)
Glucose, Bld: 98 mg/dL (ref 65–99)
POTASSIUM: 3.2 mmol/L — AB (ref 3.5–5.1)
SODIUM: 138 mmol/L (ref 135–145)
TOTAL PROTEIN: 7 g/dL (ref 6.5–8.1)
Total Bilirubin: 4.2 mg/dL — ABNORMAL HIGH (ref 0.3–1.2)

## 2016-01-15 MED ORDER — POTASSIUM CHLORIDE CRYS ER 20 MEQ PO TBCR
40.0000 meq | EXTENDED_RELEASE_TABLET | Freq: Two times a day (BID) | ORAL | Status: AC
Start: 2016-01-15 — End: 2016-01-15
  Administered 2016-01-15 (×2): 40 meq via ORAL
  Filled 2016-01-15 (×2): qty 2

## 2016-01-15 MED ORDER — LORAZEPAM 2 MG/ML IJ SOLN
1.0000 mg | Freq: Once | INTRAMUSCULAR | Status: AC
  Administered 2016-01-15: 1 mg via INTRAVENOUS
  Filled 2016-01-15: qty 1

## 2016-01-15 MED ORDER — LORAZEPAM 2 MG/ML IJ SOLN
1.0000 mg | Freq: Once | INTRAMUSCULAR | Status: AC
  Administered 2016-01-16: 1 mg via INTRAVENOUS
  Filled 2016-01-15: qty 1

## 2016-01-15 NOTE — Progress Notes (Signed)
PROGRESS NOTE    Donette LarrySamuel Janish  UJW:[id number] DOB: June 03, 1925 DOA: 01/13/2016 PCP: No primary care provider on file.  Outpatient Specialists:   Brief Narrative: 82 y.o. male with medical history significant of Dementia.  Patient presents to the ED with one day history of N/V/D, RUQ abd pain. CT and US unremarkable except for a gallbladder polyp vs non-mobile stone. Leukocytosis and mild tachycardia noted on presentation.   Assessment & Plan:   Principal Problem:   Transaminitis Active Problems:   Leukocytosis   Nausea vomiting and diarrhea  1. Transamnitis - Slowly improving.  2. N/V/D - Slowly improving 3. Leukocytosis - Improving. CBC in am. Likely DC in am if patient continues to remain stable. 4. Hypokalemia - Replete and monitor   DVT prophylaxis: Lovenox Code Status: Full Family Communication: Family at bedside Consults called: None Admission status: Admit to inpatient  Consultants:   None  Procedures:   None  Antimicrobials:   IV Unasyn   Subjective: Nil new complaints  Objective: Vitals:   01/14/16 0345 01/14/16 1543 01/14/16 2101 01/15/16 0504  BP: 133/78 130/76 138/86 (!) 143/98  Pulse: 68 (!) 106 (!) 116 (!) 115  Resp: 16  20 18   Temp: 98.2 F (36.8 C) 98.4 F (36.9 C) 98.6 F (37 C) 98.5 F (36.9 C)  TempSrc:  Oral Oral Oral  SpO2: 97% 99% 98% 98%  Weight: 106.4 kg (234 lb 9.1 oz)     Height: 6' (1.829 m)       Intake/Output Summary (Last 24 hours) at 01/15/16 1049 Last data filed at 01/14/16 1940  Gross per 24 hour  Intake             1320 ml  Output              525 ml  Net              795 ml   Filed Weights   01/13/16 2107 01/14/16 0345  Weight: 99.8 kg (220 lb) 106.4 kg (234 lb 9.1 oz)    Examination:  General exam: Appears calm and comfortable  Respiratory system: Clear to auscultation.   Cardiovascular system: S1 & S2. Gastrointestinal system: Abdomen is soft and nontender.   Central nervous system: Awake and alert.   Extremities: No leg edema.   Data Reviewed: I have personally reviewed following labs and imaging studies  CBC:  Recent Labs Lab 01/13/16 2140 01/14/16 0547  WBC 18.1* 12.4*  NEUTROABS 16.0*  --   HGB 12.8* 11.1*  HCT 36.0* 32.6*  MCV 96.0 99.1  PLT 165 158   Basic Metabolic Panel:  Recent Labs Lab 01/13/16 2140 01/14/16 0547 01/15/16 0605  NA 140 138 138  K 3.6 3.4* 3.2*  CL 107 108 106  CO2 25 25 25   GLUCOSE 134* 104* 98  BUN 20 16 12   CREATININE 0.87 0.79 0.82  CALCIUM 9.0 8.3* 8.7*   GFR: Estimated Creatinine Clearance: 78.5 mL/min (by C-G formula based on SCr of 0.82 mg/dL). Liver Function Tests:  Recent Labs Lab 01/13/16 2140 01/14/16 0547 01/15/16 0605  AST 503* 323* 167*  ALT 509* 375* 269*  ALKPHOS 114 100 143*  BILITOT 4.7* 4.6* 4.2*  PROT 7.4 6.6 7.0  ALBUMIN 3.6 3.2* 3.6    Recent Labs Lab 01/13/16 2140  LIPASE 47   No results for input(s): AMMONIA in the last 168 hours. Coagulation Profile: No results for input(s): INR, PROTIME in the last 168 hours. Cardiac Enzymes: No results for input(s):  CKTOTAL, CKMB, CKMBINDEX, TROPONINI in the last 168 hours. BNP (last 3 results) No results for input(s): PROBNP in the last 8760 hours. HbA1C: No results for input(s): HGBA1C in the last 72 hours. CBG: No results for input(s): GLUCAP in the last 168 hours. Lipid Profile: No results for input(s): CHOL, HDL, LDLCALC, TRIG, CHOLHDL, LDLDIRECT in the last 72 hours. Thyroid Function Tests: No results for input(s): TSH, T4TOTAL, FREET4, T3FREE, THYROIDAB in the last 72 hours. Anemia Panel: No results for input(s): VITAMINB12, FOLATE, FERRITIN, TIBC, IRON, RETICCTPCT in the last 72 hours. Urine analysis:    Component Value Date/Time   COLORURINE ORANGE (A) 01/13/2016 2215   APPEARANCEUR CLEAR 01/13/2016 2215   LABSPEC 1.021 01/13/2016 2215   PHURINE 6.5 01/13/2016 2215   GLUCOSEU NEGATIVE 01/13/2016 2215   HGBUR NEGATIVE 01/13/2016 2215    BILIRUBINUR MODERATE (A) 01/13/2016 2215   KETONESUR NEGATIVE 01/13/2016 2215   PROTEINUR 30 (A) 01/13/2016 2215   NITRITE NEGATIVE 01/13/2016 2215   LEUKOCYTESUR TRACE (A) 01/13/2016 2215   Sepsis Labs: @LABRCNTIP (procalcitonin:4,lacticidven:4)  )No results found for this or any previous visit (from the past 240 hour(s)).       Radiology Studies: Ct Abdomen Pelvis W Contrast  Addendum Date: 01/14/2016   ADDENDUM REPORT: 01/14/2016 01:09 ADDENDUM: 17 mm left lower lobe subpleural nodule. Consider one of the following in 3 months for both low-risk and high-risk individuals: (a) repeat chest CT, (b) follow-up PET-CT, or (c) tissue sampling. This recommendation follows the consensus statement: Guidelines for Management of Incidental Pulmonary Nodules Detected on CT Images:From the Fleischner Society 2017; published online before print (10.1148/[id number]). Electronically Signed   By: Mitzi HansenLance  Furusawa-Stratton M.D.   On: 01/14/2016 01:09   Result Date: 01/14/2016 CLINICAL DATA:  82 y/o M; abdominal distention with nausea and vomiting for 1 day. Leukocytosis. EXAM: CT ABDOMEN AND PELVIS WITH CONTRAST TECHNIQUE: Multidetector CT imaging of the abdomen and pelvis was performed using the standard protocol following bolus administration of intravenous contrast. CONTRAST:  100 cc Isovue-300. COMPARISON:  Abdominal ultrasound dated 01/13/2016. FINDINGS: Lower chest: 17 mm left lung base subpleural nodule (series 5, image 34). Small hiatal hernia. Hepatobiliary: No mass. Segment 7/8 subcentimeter lucency is probably a cyst. Probable steatosis. No biliary ductal dilatation. Pancreas: No mass, inflammatory changes, or other significant abnormality. Spleen: Within normal limits in size and appearance. Adrenals/Urinary Tract: Bilateral kidney cysts the largest in the right interpolar region measuring 18 mm. Left kidney lower pole punctate density is probably a nonobstructing stone. Normal adrenal glands.  No hydronephrosis. Small anterior bladder diverticulum. Stomach/Bowel: Small hiatal hernia. No evidence of bowel obstruction or inflammation. Moderate sigmoid diverticulosis without evidence of diverticulitis. Vascular/Lymphatic: No lymphadenopathy. Severe calcific atherosclerosis of the abdominal aorta and bilateral common iliac arteries. Reproductive: Moderate prostate enlargement with calcifications. Other: None. Musculoskeletal: The bones are demineralized. Extensive degenerative changes of the lumbar spine with multilevel severe disc space narrowing from L1-L4, endplate degenerative changes, and marginal osteophytes. Osteophyte ridging results in severe canal stenosis at the L1-2 level. No acute fracture is identified. IMPRESSION: 1. No acute process of the abdomen or pelvis is identified. 2. Probable hepatic steatosis. 3. Small hiatal hernia. 4. Left kidney lower pole punctate nonobstructing stone. No hydronephrosis. 5. Moderate sigmoid diverticulosis without evidence for diverticulitis. 6. Severe calcific atherosclerosis of the abdominal aorta. 7. Extensive degenerative changes of the lumbar spine greatest at L1-2 where there is severe bony canal stenosis. Electronically Signed: By: Mitzi HansenLance  Furusawa-Stratton M.D. On: 01/14/2016 01:05   [place] Abdomen Limited  Result Date: 01/14/2016 CLINICAL DATA:  82 y/o M; right upper quadrant pain nausea and vomiting since this morning. EXAM: US ABDOMEN LIMITED - RIGHT UPPER QUADRANT COMPARISON:  None. FINDINGS: Gallbladder: No gallstones or wall thickening visualized. No sonographic Murphy sign noted by sonographer. Echogenic focus along the wall of gallbladder may represent a polyp or nonmobile stone measuring up to 4.8 mm. Common bile duct: Diameter: 4.1 mm. Liver: No focal lesion identified. Diffusely increased echogenicity compatible with steatosis. IMPRESSION: 1. No evidence for cholecystitis. 2. 5 mm echogenic gallbladder wall focus may represent a polyp or nonmobile  stone. 3. Hepatic steatosis. Electronically Signed   By: Mitzi Hansen M.D.   On: 01/14/2016 00:28        Scheduled Meds: . ampicillin-sulbactam (UNASYN) IV  3 g Intravenous Q6H  . atorvastatin  40 mg Oral Daily  . enoxaparin (LOVENOX) injection  40 mg Subcutaneous Q24H  . levothyroxine  112 mcg Oral QAC breakfast  . memantine  10 mg Oral BID  . ondansetron (ZOFRAN) IV  4 mg Intravenous Once   Continuous Infusions: . sodium chloride 125 mL/hr at 01/15/16 0756     LOS: 1 day    Time spent: Greater than 30 minutes    Berton Mount, MD  [facility] Pager #: [phone] 7PM-7AM contact night coverage as above

## 2016-01-16 LAB — RENAL FUNCTION PANEL
Albumin: 3 g/dL — ABNORMAL LOW (ref 3.5–5.0)
Anion gap: 6 (ref 5–15)
BUN: 9 mg/dL (ref 6–20)
CO2: 22 mmol/L (ref 22–32)
Calcium: 8.2 mg/dL — ABNORMAL LOW (ref 8.9–10.3)
Chloride: 109 mmol/L (ref 101–111)
Creatinine, Ser: 0.61 mg/dL (ref 0.61–1.24)
GFR calc Af Amer: >60 mL/min (ref >60)
GFR calc non Af Amer: >60 mL/min (ref >60)
Glucose, Bld: 119 mg/dL — ABNORMAL HIGH (ref 65–99)
Phosphorus: 1.7 mg/dL — ABNORMAL LOW (ref 2.5–4.6)
Potassium: 3.5 mmol/L (ref 3.5–5.1)
Sodium: 137 mmol/L (ref 135–145)

## 2016-01-16 MED ORDER — HYDRALAZINE HCL 20 MG/ML IJ SOLN
10.0000 mg | Freq: Once | INTRAMUSCULAR | Status: AC
  Administered 2016-01-16: 10 mg via INTRAVENOUS
  Filled 2016-01-16: qty 1

## 2016-01-16 MED ORDER — GUAIFENESIN ER 600 MG PO TB12: 1200.0000 mg | ORAL_TABLET | Freq: Two times a day (BID) | ORAL | Status: DC | PRN

## 2016-01-16 NOTE — Evaluation (Signed)
Physical Therapy Evaluation Patient Details Name: Philip Morse MRN: [id number] DOB: 12/14/1925 Today's Date: 01/16/2016   History of Present Illness  Pt is an 82 year old male with hx of dementia admitted with N/V/D and diagnosed with Transaminitis  Clinical Impression  Pt admitted with above diagnosis. Pt currently with functional limitations due to the deficits listed below (see PT Problem List).  Pt will benefit from skilled PT to increase their independence and safety with mobility to allow discharge to the venue listed below.  Pt with unintelligible speech and not opening eyes despite cues.  Pt not following commands so deferred mobility.  Pt clearing throat and producing increased amounts of sputum (also just spitting into room so provided assist with tissues).  CSW reports discussion with pt and spouse yesterday and pt with better cognition.  Recommend d/c to SNF prior to return to ILF.     Follow Up Recommendations SNF;Supervision/Assistance - 24 hour    Equipment Recommendations  Other (comment) (per next venue)    Recommendations for Other Services       Precautions / Restrictions Precautions Precautions: Fall Restrictions Weight Bearing Restrictions: No      Mobility  Bed Mobility Overal bed mobility: +2 for physical assistance             General bed mobility comments: pt not following commands well with moving extremities, deferred bed mobility, vitals documentated in flowsheet with high BP (however NT states blue cuff has been giving higher readings for pt), HR in 110s at rest  Transfers                    Ambulation/Gait                Stairs            Wheelchair Mobility    Modified Rankin (Stroke Patients Only)       Balance                                             Pertinent Vitals/Pain Pain Assessment: Faces Faces Pain Scale: Hurts a little bit    Home Living   Living Arrangements:  Spouse/significant other   Type of Home: Independent living facility           Additional Comments: per CSW, pt from ILF with spouse, pt poor historian    Prior Function           Comments: uncertain however from ILF, likely ambulatory     Hand Dominance        Extremity/Trunk Assessment   Upper Extremity Assessment: Generalized weakness;Difficult to assess due to impaired cognition           Lower Extremity Assessment: Generalized weakness;Difficult to assess due to impaired cognition         Communication   Communication: Expressive difficulties (mumbled speech)  Cognition Arousal/Alertness: Lethargic   Overall Cognitive Status: No family/caregiver present to determine baseline cognitive functioning Area of Impairment: Following commands       Following Commands: Follows one step commands inconsistently       General Comments: hx of dementia, pt not opening eyes during session, attempting to answer questions however mumbled unintelligible speech    General Comments      Exercises        Assessment/Plan    PT Assessment Patient needs  continued PT services  PT Diagnosis Difficulty walking;Generalized weakness;Other (comment) (?AMS)   PT Problem List Decreased strength;Decreased activity tolerance;Decreased balance;Decreased cognition;Decreased mobility  PT Treatment Interventions DME instruction;Functional mobility training;Gait training;Therapeutic exercise;Therapeutic activities;Patient/family education   PT Goals (Current goals can be found in the Care Plan section) Acute Rehab PT Goals PT Goal Formulation: Patient unable to participate in goal setting Time For Goal Achievement: 01/30/16 Potential to Achieve Goals: Fair    Frequency Min 3X/week   Barriers to discharge        Co-evaluation               End of Session   Activity Tolerance: Patient limited by lethargy;Other (comment) (limited by cognition) Patient left: with  call bell/phone within reach;in bed;with nursing/sitter in room;with bed alarm set           Time: [id number] PT Time Calculation (min) (ACUTE ONLY): 18 min   Charges:   PT Evaluation $PT Eval Low Complexity: 1 Procedure     PT G Codes:        Lebaron Bautch,KATHrine E 01/16/2016, 11:39 AM Zenovia JarredKati Tyrese Capriotti, PT, DPT 01/16/2016 Pager: [phone]

## 2016-01-16 NOTE — [place] FL2 (Signed)
  [place] MEDICAID FL2 LEVEL OF CARE SCREENING TOOL     IDENTIFICATION  Patient Name: Philip Morse Birthdate: August 07, 1925 Sex: male Admission Date (Current Location): 01/13/2016  [facility] [place]   Facility and Address:  [facility],  [address], [place] [id number]      Provider Number: [id number]  Attending Physician Name and Address:  Barnetta ChapelSylvester I Ogbata, MD  Relative Name and Phone Number:       Current Level of Care: Hospital Recommended Level of Care: Skilled Nursing Facility Prior Approval Number:    Date Approved/Denied:   PASRR Number:    Discharge Plan: SNF    Current Diagnoses: Patient Active Problem List   Diagnosis Date Noted  . Transaminitis 01/14/2016  . Leukocytosis 01/14/2016  . Nausea vomiting and diarrhea 01/14/2016    Orientation RESPIRATION BLADDER Height & Weight     Place  Normal Incontinent Weight: 234 lb 9.1 oz (106.4 kg) Height:  6' (182.9 cm)  BEHAVIORAL SYMPTOMS/MOOD NEUROLOGICAL BOWEL NUTRITION STATUS      Incontinent Diet  AMBULATORY STATUS COMMUNICATION OF NEEDS Skin   Extensive Assist Verbally Normal                       Personal Care Assistance Level of Assistance  Bathing, Feeding, Dressing Bathing Assistance: Limited assistance Feeding assistance: Limited assistance Dressing Assistance: Limited assistance     Functional Limitations Info  Sight, Hearing, Speech Sight Info: Adequate Hearing Info: Adequate Speech Info: Adequate    SPECIAL CARE FACTORS FREQUENCY  PT (By licensed PT)     PT Frequency: 3              Contractures Contractures Info: Not present    Additional Factors Info  Code Status, Insulin Sliding Scale Code Status Info: No Known Allergies             Current Medications (01/16/2016):  This is the current hospital active medication list Current Facility-Administered Medications  Medication Dose Route Frequency Provider Last Rate Last Dose  .  0.9 %  sodium chloride infusion   Intravenous Continuous Hillary BowJared M Gardner, DO 125 mL/hr at 01/16/16 [id number]    . Ampicillin-Sulbactam (UNASYN) 3 g in sodium chloride 0.9 % 100 mL IVPB  3 g Intravenous Q6H Hillary BowJared M Gardner, DO 100 mL/hr at 01/16/16 0515 3 g at 01/16/16 0515  . atorvastatin (LIPITOR) tablet 40 mg  40 mg Oral Daily Hillary BowJared M Gardner, DO   40 mg at 01/16/16 [id number]  . enoxaparin (LOVENOX) injection 40 mg  40 mg Subcutaneous Q24H Hillary BowJared M Gardner, DO   40 mg at 01/16/16 [id number]  . levothyroxine (SYNTHROID, LEVOTHROID) tablet 112 mcg  112 mcg Oral QAC breakfast Hillary BowJared M Gardner, DO   112 mcg at 01/16/16 0957  . memantine (NAMENDA) tablet 10 mg  10 mg Oral BID Hillary BowJared M Gardner, DO   10 mg at 01/16/16 [id number]  . ondansetron (ZOFRAN) injection 4 mg  4 mg Intravenous Once Antony MaduraKelly Humes, PA-C   Stopped at 01/13/16 2201  . ondansetron (ZOFRAN) injection 4 mg  4 mg Intravenous Q6H PRN Hillary BowJared M Gardner, DO         Discharge Medications: Please see discharge summary for a list of discharge medications.  Relevant Imaging Results:  Relevant Lab Results:   Additional Information ss# [id number]  Clearance CootsNicole A Vivan Agostino, LCSW

## 2016-01-16 NOTE — Progress Notes (Signed)
PROGRESS NOTE    Philip Morse  ZOX:[id number] DOB: [birth year] DOA: 01/13/2016 PCP: No primary care provider on file.  Outpatient Specialists:   Brief Narrative: 82 y.o. male with medical history significant of Dementia.  Patient presents to the ED with one day history of N/V/D, RUQ abd pain. CT and US unremarkable except for a gallbladder polyp vs non-mobile stone. Leukocytosis and mild tachycardia noted on presentation.   Assessment & Plan:   Principal Problem:   Transaminitis Active Problems:   Leukocytosis   Nausea vomiting and diarrhea  1. Transamnitis - Slowly improving. Repeat labs in am. 2. N/V/D - Resolved. 3. Leukocytosis - Improving. CBC in am. Likely DC in am if patient continues to remain stable. 4. Hypokalemia - Replete and monitor  DVT prophylaxis: Lovenox Code Status: Full Family Communication: Family at bedside Consults called: None Admission status: Admit to inpatient  Consultants:   None  Procedures:   None  Antimicrobials:   IV Unasyn   Subjective: Nil new complaints  Objective: Vitals:   01/15/16 2116 01/16/16 0516 01/16/16 0938 01/16/16 1500  BP: (!) 167/103 (!) 158/110 (!) 156/110   Pulse: (!) 59 (!) 117 (!) 118 (!) 126  Resp: 18 (!) 24  20  Temp: 97.6 F (36.4 C) 98.4 F (36.9 C)  97.4 F (36.3 C)  TempSrc: Oral Oral  Oral  SpO2: 95% 94% 98%   Weight:      Height:        Intake/Output Summary (Last 24 hours) at 01/16/16 1652 Last data filed at 01/16/16 1300  Gross per 24 hour  Intake                0 ml  Output                0 ml  Net                0 ml   Filed Weights   01/13/16 2107 01/14/16 0345  Weight: 99.8 kg (220 lb) 106.4 kg (234 lb 9.1 oz)    Examination:  General exam: Appears calm and comfortable  Respiratory system: Clear to auscultation.   Cardiovascular system: S1 & S2. Gastrointestinal system: Abdomen is soft and nontender.   Central nervous system: Awake and alert.  Extremities: No leg edema.     Data Reviewed: I have personally reviewed following labs and imaging studies  CBC:  Recent Labs Lab 01/13/16 2140 01/14/16 0547  WBC 18.1* 12.4*  NEUTROABS 16.0*  --   HGB 12.8* 11.1*  HCT 36.0* 32.6*  MCV 96.0 99.1  PLT 165 158   Basic Metabolic Panel:  Recent Labs Lab 01/13/16 2140 01/14/16 0547 01/15/16 0605 01/16/16 0600  NA 140 138 138 137  K 3.6 3.4* 3.2* 3.5  CL 107 108 106 109  CO2 25 25 25 22   GLUCOSE 134* 104* 98 119*  BUN 20 16 12 9   CREATININE 0.87 0.79 0.82 0.61  CALCIUM 9.0 8.3* 8.7* 8.2*  PHOS  --   --   --  1.7*   GFR: Estimated Creatinine Clearance: 80.4 mL/min (by C-G formula based on SCr of 0.8 mg/dL). Liver Function Tests:  Recent Labs Lab 01/13/16 2140 01/14/16 0547 01/15/16 0605 01/16/16 0600  AST 503* 323* 167*  --   ALT 509* 375* 269*  --   ALKPHOS 114 100 143*  --   BILITOT 4.7* 4.6* 4.2*  --   PROT 7.4 6.6 7.0  --   ALBUMIN 3.6 3.2* 3.6  3.0*    Recent Labs Lab 01/13/16 2140  LIPASE 47   No results for input(s): AMMONIA in the last 168 hours. Coagulation Profile: No results for input(s): INR, PROTIME in the last 168 hours. Cardiac Enzymes: No results for input(s): CKTOTAL, CKMB, CKMBINDEX, TROPONINI in the last 168 hours. BNP (last 3 results) No results for input(s): PROBNP in the last 8760 hours. HbA1C: No results for input(s): HGBA1C in the last 72 hours. CBG: No results for input(s): GLUCAP in the last 168 hours. Lipid Profile: No results for input(s): CHOL, HDL, LDLCALC, TRIG, CHOLHDL, LDLDIRECT in the last 72 hours. Thyroid Function Tests: No results for input(s): TSH, T4TOTAL, FREET4, T3FREE, THYROIDAB in the last 72 hours. Anemia Panel: No results for input(s): VITAMINB12, FOLATE, FERRITIN, TIBC, IRON, RETICCTPCT in the last 72 hours. Urine analysis:    Component Value Date/Time   COLORURINE ORANGE (A) 01/13/2016 2215   APPEARANCEUR CLEAR 01/13/2016 2215   LABSPEC 1.021 01/13/2016 2215   PHURINE 6.5  01/13/2016 2215   GLUCOSEU NEGATIVE 01/13/2016 2215   HGBUR NEGATIVE 01/13/2016 2215   BILIRUBINUR MODERATE (A) 01/13/2016 2215   KETONESUR NEGATIVE 01/13/2016 2215   PROTEINUR 30 (A) 01/13/2016 2215   NITRITE NEGATIVE 01/13/2016 2215   LEUKOCYTESUR TRACE (A) 01/13/2016 2215   Sepsis Labs: @LABRCNTIP (procalcitonin:4,lacticidven:4)  ) Recent Results (from the past 240 hour(s))  Culture, blood (routine x 2)     Status: None (Preliminary result)   Collection Time: 01/14/16  5:45 AM  Result Value Ref Range Status   Specimen Description BLOOD LEFT ANTECUBITAL  Final   Special Requests IN PEDIATRIC BOTTLE 2CC  Final   Culture   Final    NO GROWTH 2 DAYS Performed at [facility]    Report Status PENDING  Incomplete  Culture, blood (routine x 2)     Status: None (Preliminary result)   Collection Time: 01/14/16  5:45 AM  Result Value Ref Range Status   Specimen Description BLOOD RIGHT ANTECUBITAL  Final   Special Requests IN PEDIATRIC BOTTLE 1.5CC  Final   Culture   Final    NO GROWTH 2 DAYS Performed at [facility]    Report Status PENDING  Incomplete         Radiology Studies: No results found.      Scheduled Meds: . ampicillin-sulbactam (UNASYN) IV  3 g Intravenous Q6H  . atorvastatin  40 mg Oral Daily  . enoxaparin (LOVENOX) injection  40 mg Subcutaneous Q24H  . levothyroxine  112 mcg Oral QAC breakfast  . memantine  10 mg Oral BID  . ondansetron (ZOFRAN) IV  4 mg Intravenous Once   Continuous Infusions: . sodium chloride 125 mL/hr at 01/16/16 1626     LOS: 2 days    Time spent: Greater than 30 minutes    Berton MountSylvester Deniss Wormley, MD  [facility] Pager #: [phone] 7PM-7AM contact night coverage as above

## 2016-01-17 ENCOUNTER — Encounter (HOSPITAL_COMMUNITY): Payer: Self-pay

## 2016-01-17 LAB — CBC WITH DIFFERENTIAL/PLATELET
Basophils Absolute: 0 10*3/uL (ref 0.0–0.1)
Basophils Relative: 0 %
Eosinophils Absolute: 0.1 10*3/uL (ref 0.0–0.7)
Eosinophils Relative: 2 %
HCT: 31.1 % — ABNORMAL LOW (ref 39.0–52.0)
Hemoglobin: 10.7 g/dL — ABNORMAL LOW (ref 13.0–17.0)
Lymphocytes Relative: 15 %
Lymphs Abs: 0.9 10*3/uL (ref 0.7–4.0)
MCH: 33.5 pg (ref 26.0–34.0)
MCHC: 34.4 g/dL (ref 30.0–36.0)
MCV: 97.5 fL (ref 78.0–100.0)
Monocytes Absolute: 0.4 10*3/uL (ref 0.1–1.0)
Monocytes Relative: 7 %
Neutro Abs: 4.5 10*3/uL (ref 1.7–7.7)
Neutrophils Relative %: 76 %
Platelets: 137 10*3/uL — ABNORMAL LOW (ref 150–400)
RBC: 3.19 MIL/uL — ABNORMAL LOW (ref 4.22–5.81)
RDW: 13.7 % (ref 11.5–15.5)
WBC: 6 10*3/uL (ref 4.0–10.5)

## 2016-01-17 LAB — RENAL FUNCTION PANEL
Albumin: 2.9 g/dL — ABNORMAL LOW (ref 3.5–5.0)
Anion gap: 6 (ref 5–15)
BUN: 10 mg/dL (ref 6–20)
CO2: 23 mmol/L (ref 22–32)
Calcium: 8.5 mg/dL — ABNORMAL LOW (ref 8.9–10.3)
Chloride: 111 mmol/L (ref 101–111)
Creatinine, Ser: 0.65 mg/dL (ref 0.61–1.24)
GFR calc Af Amer: >60 mL/min (ref >60)
GFR calc non Af Amer: >60 mL/min (ref >60)
Glucose, Bld: 82 mg/dL (ref 65–99)
Phosphorus: 2.3 mg/dL — ABNORMAL LOW (ref 2.5–4.6)
Potassium: 3.5 mmol/L (ref 3.5–5.1)
Sodium: 140 mmol/L (ref 135–145)

## 2016-01-17 LAB — MAGNESIUM: Magnesium: 1.7 mg/dL (ref 1.7–2.4)

## 2016-01-17 MED ORDER — AMOXICILLIN-POT CLAVULANATE 875-125 MG PO TABS: 1.0000 | ORAL_TABLET | Freq: Two times a day (BID) | ORAL | 0 refills | Status: AC

## 2016-01-17 MED ORDER — RIVAROXABAN 20 MG PO TABS
20.0000 mg | ORAL_TABLET | Freq: Every day | ORAL | Status: DC
  Administered 2016-01-17: 20 mg via ORAL
  Filled 2016-01-17: qty 1

## 2016-01-17 MED ORDER — K PHOS MONO-SOD PHOS DI & MONO 155-852-130 MG PO TABS
500.0000 mg | ORAL_TABLET | Freq: Two times a day (BID) | ORAL | Status: DC
  Administered 2016-01-17: 500 mg via ORAL
  Filled 2016-01-17: qty 2

## 2016-01-17 MED ORDER — K PHOS MONO-SOD PHOS DI & MONO 155-852-130 MG PO TABS: 500.0000 mg | ORAL_TABLET | Freq: Two times a day (BID) | ORAL | 0 refills | Status: AC

## 2016-01-17 MED ORDER — GUAIFENESIN ER 600 MG PO TB12: 600.0000 mg | ORAL_TABLET | Freq: Two times a day (BID) | ORAL | 0 refills | Status: AC | PRN

## 2016-01-17 NOTE — Progress Notes (Signed)
[facility], [phone]. [facility]

## 2016-01-17 NOTE — Care Management Note (Signed)
D/C SNF

## 2016-01-17 NOTE — Progress Notes (Signed)
Over the past 2 nights this RN has noticed the patient has had multiple episodes where he becomes agitated, extremely figity, more confused, and nonverbal except for grunts and moans. The patent becomes unable to follow commands nor be redirected. During this time the patient's breathing becomes shallow and labored, however, his oxygen saturation maintains at or above 92% and his heart rate remains at baseline. After he relaxes breathing returns to normal. This RN placed the patient on 2L [place] overnight to ensure the patient is receiving adequate oxygen.

## 2016-01-17 NOTE — Clinical Social Work Placement (Addendum)
   CLINICAL SOCIAL WORK PLACEMENT  NOTE  Date:  01/17/2016  Patient Details  Name: Philip Morse MRN: [id number] Date of Birth: 22-Nov-1925  Clinical Social Work is seeking post-discharge placement for this patient at the Skilled  Nursing Facility level of care (*CSW will initial, date and re-position this form in  chart as items are completed):  Yes   Patient/family provided with [facility] Clinical Social Work Department's list of facilities offering this level of care within the geographic area requested by the patient (or if unable, by the patient's family).  Yes   Patient/family informed of their freedom to choose among providers that offer the needed level of care, that participate in Medicare, Medicaid or managed care program needed by the patient, have an available bed and are willing to accept the patient.  Yes   Patient/family informed of [facility]'s ownership interest in [facility] and [facility], as well as of the fact that they are under no obligation to receive care at these facilities.  PASRR submitted to EDS on  01/17/2016     PASRR number received on   01/17/2016   Existing PASRR number confirmed on       FL2 transmitted to all facilities in geographic area requested by pt/family on  01/15/2016     FL2 transmitted to all facilities within larger geographic area on    01/15/2016   Patient informed that his/her managed care company has contracts with or will negotiate with certain facilities, including the following:  [facility]     Yes   Patient/family informed of bed offers received.  [facility]     Physician recommends and patient chooses bed at      [name] on 01/17/16.  [name] to facility by [facility]     Patient family notified on 01/17/16 of transfer.  Name of family member notified:  Daughter: Theodora BlowLorie Pope: [phone]      PHYSICIAN       Additional Comment:    _______________________________________________ Clearance CootsNicole A Gustav Knueppel, LCSW 01/17/2016, 11:26 AM

## 2016-01-17 NOTE — Progress Notes (Addendum)
[facility] called for transport RN given number for report.

## 2016-01-17 NOTE — Discharge Summary (Signed)
Physician Discharge Summary  Philip Morse ZOX:[id number] DOB: 09/05/1925 DOA: 01/13/2016  PCP: No primary care provider on file.  Admit date: 01/13/2016 Discharge date: 01/17/2016  Time spent: Greater than 30 minutes  Recommendations for Outpatient Follow-up:  1. Repeat LFT's in one week 2. Consider restarting statins when LFT's are back to normal range  3. Discharge patient to SNF [facility]). 4. Follow up with PCP within one week.  Discharge Diagnoses:  Principal Problem:   Transaminitis Active Problems:   Leukocytosis   Nausea vomiting and diarrhea  Discharge Condition: Stable  Diet recommendation: Cardiac/Low fat diet  Filed Weights   01/13/16 2107 01/14/16 0345  Weight: 99.8 kg (220 lb) 106.4 kg (234 lb 9.1 oz)    History of present illness: 82 year oldmalewith medical history significant of Dementia. Patient presented to the ED with one day history of nausea, vomiting, diarrhea and right upper quadrant pain. CT and US unremarkable except for a gallbladder polyp vs non-mobile stone. Leukocytosis and mild tachycardia were noted on presentation.  Hospital Course: Patient was admitted for further assessment and management. CT Scan of the abdomen revealed probable hepatic steatosis. Ultrasound of the abdomen was negative for cholecystitis. Patient was managed supportively. LFT's are trending downwards. Tachycardia and Leukocytosis have resolved. Patient will be discharged to a SNF. Statins will be on hold until LFT's are back to normal.  Procedures:  None  Consultations:  None  Discharge Exam: Vitals:   01/16/16 2101 01/17/16 0515  BP: (!) 154/109 (!) 152/96  Pulse: (!) 119 (!) 117  Resp: 18 18  Temp: 98.7 F (37.1 C) 97.7 F (36.5 C)    General: Obese. Not in distress Cardiovascular: S1S2 Respiratory: Clear to auscultation  Discharge Instructions   Discharge Instructions    Diet - low sodium heart healthy    Complete by:  As directed   Discharge  instructions    Complete by:  As directed   Repeat LFT's in one week.   Increase activity slowly    Complete by:  As directed     Current Discharge Medication List    START taking these medications   Details  amoxicillin-clavulanate (AUGMENTIN) 875-125 MG tablet Take 1 tablet by mouth 2 (two) times daily. Qty: 10 tablet, Refills: 0    guaiFENesin (MUCINEX) 600 MG 12 hr tablet Take 1 tablet (600 mg total) by mouth 2 (two) times daily as needed for cough or to loosen phlegm. Qty: 20 tablet, Refills: 0    phosphorus (K PHOS NEUTRAL) 155-852-130 MG tablet Take 2 tablets (500 mg total) by mouth 2 (two) times daily. Qty: 16 tablet, Refills: 0      CONTINUE these medications which have NOT CHANGED   Details  levothyroxine (SYNTHROID, LEVOTHROID) 112 MCG tablet Take 112 mcg by mouth daily before breakfast.    memantine (NAMENDA) 10 MG tablet Take 10 mg by mouth 2 (two) times daily.    metoprolol succinate (TOPROL-XL) 50 MG 24 hr tablet Take 50 mg by mouth daily. Take with or immediately following a meal.    rivaroxaban (XARELTO) 20 MG TABS tablet Take 20 mg by mouth daily with supper.      STOP taking these medications     atorvastatin (LIPITOR) 40 MG tablet      hydrochlorothiazide (MICROZIDE) 12.5 MG capsule        No Known Allergies    The results of significant diagnostics from this hospitalization (including imaging, microbiology, ancillary and laboratory) are listed below for reference.  Significant Diagnostic Studies: Ct Abdomen Pelvis W Contrast  Addendum Date: 01/14/2016   ADDENDUM REPORT: 01/14/2016 01:09 ADDENDUM: 17 mm left lower lobe subpleural nodule. Consider one of the following in 3 months for both low-risk and high-risk individuals: (a) repeat chest CT, (b) follow-up PET-CT, or (c) tissue sampling. This recommendation follows the consensus statement: Guidelines for Management of Incidental Pulmonary Nodules Detected on CT Images:From the Fleischner Society  2017; published online before print (10.1148/radiol.7829562130830 176 3908). Electronically Signed   By: Mitzi HansenLance  Furusawa-Stratton M.D.   On: 01/14/2016 01:09   Result Date: 01/14/2016 CLINICAL DATA:  82 y/o M; abdominal distention with nausea and vomiting for 1 day. Leukocytosis. EXAM: CT ABDOMEN AND PELVIS WITH CONTRAST TECHNIQUE: Multidetector CT imaging of the abdomen and pelvis was performed using the standard protocol following bolus administration of intravenous contrast. CONTRAST:  100 cc Isovue-300. COMPARISON:  Abdominal ultrasound dated 01/13/2016. FINDINGS: Lower chest: 17 mm left lung base subpleural nodule (series 5, image 34). Small hiatal hernia. Hepatobiliary: No mass. Segment 7/8 subcentimeter lucency is probably a cyst. Probable steatosis. No biliary ductal dilatation. Pancreas: No mass, inflammatory changes, or other significant abnormality. Spleen: Within normal limits in size and appearance. Adrenals/Urinary Tract: Bilateral kidney cysts the largest in the right interpolar region measuring 18 mm. Left kidney lower pole punctate density is probably a nonobstructing stone. Normal adrenal glands. No hydronephrosis. Small anterior bladder diverticulum. Stomach/Bowel: Small hiatal hernia. No evidence of bowel obstruction or inflammation. Moderate sigmoid diverticulosis without evidence of diverticulitis. Vascular/Lymphatic: No lymphadenopathy. Severe calcific atherosclerosis of the abdominal aorta and bilateral common iliac arteries. Reproductive: Moderate prostate enlargement with calcifications. Other: None. Musculoskeletal: The bones are demineralized. Extensive degenerative changes of the lumbar spine with multilevel severe disc space narrowing from L1-L4, endplate degenerative changes, and marginal osteophytes. Osteophyte ridging results in severe canal stenosis at the L1-2 level. No acute fracture is identified. IMPRESSION: 1. No acute process of the abdomen or pelvis is identified. 2. Probable hepatic  steatosis. 3. Small hiatal hernia. 4. Left kidney lower pole punctate nonobstructing stone. No hydronephrosis. 5. Moderate sigmoid diverticulosis without evidence for diverticulitis. 6. Severe calcific atherosclerosis of the abdominal aorta. 7. Extensive degenerative changes of the lumbar spine greatest at L1-2 where there is severe bony canal stenosis. Electronically Signed: By: Mitzi HansenLance  Furusawa-Stratton M.D. On: 01/14/2016 01:05   [place] Abdomen Limited  Result Date: 01/14/2016 CLINICAL DATA:  82 y/o M; right upper quadrant pain nausea and vomiting since this morning. EXAM: US ABDOMEN LIMITED - RIGHT UPPER QUADRANT COMPARISON:  None. FINDINGS: Gallbladder: No gallstones or wall thickening visualized. No sonographic Murphy sign noted by sonographer. Echogenic focus along the wall of gallbladder may represent a polyp or nonmobile stone measuring up to 4.8 mm. Common bile duct: Diameter: 4.1 mm. Liver: No focal lesion identified. Diffusely increased echogenicity compatible with steatosis. IMPRESSION: 1. No evidence for cholecystitis. 2. 5 mm echogenic gallbladder wall focus may represent a polyp or nonmobile stone. 3. Hepatic steatosis. Electronically Signed   By: Mitzi HansenLance  Furusawa-Stratton M.D.   On: 01/14/2016 00:28    Microbiology: Recent Results (from the past 240 hour(s))  Culture, blood (routine x 2)     Status: None (Preliminary result)   Collection Time: 01/14/16  5:45 AM  Result Value Ref Range Status   Specimen Description BLOOD LEFT ANTECUBITAL  Final   Special Requests IN PEDIATRIC BOTTLE 2CC  Final   Culture   Final    NO GROWTH 2 DAYS Performed at [facility]    Report  Status PENDING  Incomplete  Culture, blood (routine x 2)     Status: None (Preliminary result)   Collection Time: 01/14/16  5:45 AM  Result Value Ref Range Status   Specimen Description BLOOD RIGHT ANTECUBITAL  Final   Special Requests IN PEDIATRIC BOTTLE 1.5CC  Final   Culture   Final    NO GROWTH 2  DAYS Performed at [facility]    Report Status PENDING  Incomplete     Labs: Basic Metabolic Panel:  Recent Labs Lab 01/13/16 2140 01/14/16 0547 01/15/16 0605 01/16/16 0600 01/17/16 0950  NA 140 138 138 137 140  K 3.6 3.4* 3.2* 3.5 3.5  CL 107 108 106 109 111  CO2 25 25 25 22 23   GLUCOSE 134* 104* 98 119* 82  BUN 20 16 12 9 10   CREATININE 0.87 0.79 0.82 0.61 0.65  CALCIUM 9.0 8.3* 8.7* 8.2* 8.5*  MG  --   --   --   --  1.7  PHOS  --   --   --  1.7* 2.3*   Liver Function Tests:  Recent Labs Lab 01/13/16 2140 01/14/16 0547 01/15/16 0605 01/16/16 0600 01/17/16 0950  AST 503* 323* 167*  --   --   ALT 509* 375* 269*  --   --   ALKPHOS 114 100 143*  --   --   BILITOT 4.7* 4.6* 4.2*  --   --   PROT 7.4 6.6 7.0  --   --   ALBUMIN 3.6 3.2* 3.6 3.0* 2.9*    Recent Labs Lab 01/13/16 2140  LIPASE 47   No results for input(s): AMMONIA in the last 168 hours. CBC:  Recent Labs Lab 01/13/16 2140 01/14/16 0547 01/17/16 0950  WBC 18.1* 12.4* 6.0  NEUTROABS 16.0*  --  4.5  HGB 12.8* 11.1* 10.7*  HCT 36.0* 32.6* 31.1*  MCV 96.0 99.1 97.5  PLT 165 158 137*   Cardiac Enzymes: No results for input(s): CKTOTAL, CKMB, CKMBINDEX, TROPONINI in the last 168 hours. BNP: BNP (last 3 results) No results for input(s): BNP in the last 8760 hours.  ProBNP (last 3 results) No results for input(s): PROBNP in the last 8760 hours.  CBG: No results for input(s): GLUCAP in the last 168 hours.     Signed:  Berton Mount, MD  [facility] Pager #: [phone] 7PM-7AM contact night coverage as above

## 2016-01-17 NOTE — Progress Notes (Signed)
MD made aware of elevated BP's and [facility] refusal to transport, advised to take manually, 140/90, [facility] aware and transporting pt to SNF

## 2016-01-19 LAB — CULTURE, BLOOD (ROUTINE X 2)
CULTURE: NO GROWTH
Culture: NO GROWTH

## 2016-02-24 ENCOUNTER — Emergency Department (HOSPITAL_COMMUNITY): Payer: Medicare Other

## 2016-02-24 ENCOUNTER — Encounter (HOSPITAL_COMMUNITY): Payer: Self-pay

## 2016-02-24 ENCOUNTER — Inpatient Hospital Stay (HOSPITAL_COMMUNITY)
Admission: EM | Admit: 2016-02-24 | Discharge: 2016-02-27 | DRG: 536 | Disposition: A | Discharge status: Skilled Nursing Facility | Payer: Medicare Other | Attending: Internal Medicine | Admitting: Internal Medicine

## 2016-02-24 DIAGNOSIS — E039 Hypothyroidism, unspecified: Secondary | ICD-10-CM | POA: Diagnosis present

## 2016-02-24 DIAGNOSIS — Y92003 Bedroom of unspecified non-institutional (private) residence as the place of occurrence of the external cause: Secondary | ICD-10-CM | POA: Diagnosis not present

## 2016-02-24 DIAGNOSIS — S32591A Other specified fracture of right pubis, initial encounter for closed fracture: Principal | ICD-10-CM | POA: Diagnosis present

## 2016-02-24 DIAGNOSIS — R109 Unspecified abdominal pain: Secondary | ICD-10-CM | POA: Diagnosis present

## 2016-02-24 DIAGNOSIS — S32591D Other specified fracture of right pubis, subsequent encounter for fracture with routine healing: Secondary | ICD-10-CM | POA: Diagnosis not present

## 2016-02-24 DIAGNOSIS — M25561 Pain in right knee: Secondary | ICD-10-CM | POA: Diagnosis present

## 2016-02-24 DIAGNOSIS — I10 Essential (primary) hypertension: Secondary | ICD-10-CM | POA: Diagnosis present

## 2016-02-24 DIAGNOSIS — I4891 Unspecified atrial fibrillation: Secondary | ICD-10-CM | POA: Diagnosis present

## 2016-02-24 DIAGNOSIS — R51 Headache: Secondary | ICD-10-CM | POA: Diagnosis present

## 2016-02-24 DIAGNOSIS — F0390 Unspecified dementia without behavioral disturbance: Secondary | ICD-10-CM | POA: Diagnosis present

## 2016-02-24 DIAGNOSIS — Z7901 Long term (current) use of anticoagulants: Secondary | ICD-10-CM

## 2016-02-24 DIAGNOSIS — R262 Difficulty in walking, not elsewhere classified: Secondary | ICD-10-CM

## 2016-02-24 DIAGNOSIS — W06XXXA Fall from bed, initial encounter: Secondary | ICD-10-CM | POA: Diagnosis present

## 2016-02-24 DIAGNOSIS — L8990 Pressure ulcer of unspecified site, unspecified stage: Secondary | ICD-10-CM | POA: Insufficient documentation

## 2016-02-24 DIAGNOSIS — Z79899 Other long term (current) drug therapy: Secondary | ICD-10-CM

## 2016-02-24 DIAGNOSIS — E785 Hyperlipidemia, unspecified: Secondary | ICD-10-CM | POA: Diagnosis present

## 2016-02-24 DIAGNOSIS — Z7983 Long term (current) use of bisphosphonates: Secondary | ICD-10-CM | POA: Diagnosis not present

## 2016-02-24 DIAGNOSIS — W19XXXA Unspecified fall, initial encounter: Secondary | ICD-10-CM

## 2016-02-24 DIAGNOSIS — Z9181 History of falling: Secondary | ICD-10-CM

## 2016-02-24 DIAGNOSIS — E7849 Other hyperlipidemia: Secondary | ICD-10-CM

## 2016-02-24 DIAGNOSIS — S32501D Unspecified fracture of right pubis, subsequent encounter for fracture with routine healing: Secondary | ICD-10-CM | POA: Diagnosis not present

## 2016-02-24 DIAGNOSIS — R296 Repeated falls: Secondary | ICD-10-CM | POA: Diagnosis not present

## 2016-02-24 DIAGNOSIS — E784 Other hyperlipidemia: Secondary | ICD-10-CM | POA: Diagnosis not present

## 2016-02-24 DIAGNOSIS — S32599A Other specified fracture of unspecified pubis, initial encounter for closed fracture: Secondary | ICD-10-CM | POA: Diagnosis present

## 2016-02-24 LAB — COMPREHENSIVE METABOLIC PANEL
ALT: 30 U/L (ref 17–63)
AST: 28 U/L (ref 15–41)
Albumin: 3.7 g/dL (ref 3.5–5.0)
Alkaline Phosphatase: 89 U/L (ref 38–126)
Anion gap: 11 (ref 5–15)
BILIRUBIN TOTAL: 1.1 mg/dL (ref 0.3–1.2)
BUN: 16 mg/dL (ref 6–20)
CALCIUM: 10.1 mg/dL (ref 8.9–10.3)
CHLORIDE: 103 mmol/L (ref 101–111)
CO2: 27 mmol/L (ref 22–32)
CREATININE: 1.04 mg/dL (ref 0.61–1.24)
Glucose, Bld: 111 mg/dL — ABNORMAL HIGH (ref 65–99)
Potassium: 3.9 mmol/L (ref 3.5–5.1)
SODIUM: 141 mmol/L (ref 135–145)
TOTAL PROTEIN: 7.9 g/dL (ref 6.5–8.1)

## 2016-02-24 LAB — URINE MICROSCOPIC-ADD ON

## 2016-02-24 LAB — LIPASE, BLOOD: LIPASE: 65 U/L — AB (ref 11–51)

## 2016-02-24 LAB — CBC WITH DIFFERENTIAL/PLATELET
BASOS ABS: 0 10*3/uL (ref 0.0–0.1)
BASOS PCT: 0 %
EOS ABS: 0.1 10*3/uL (ref 0.0–0.7)
EOS PCT: 1 %
HCT: 41.5 % (ref 39.0–52.0)
Hemoglobin: 14 g/dL (ref 13.0–17.0)
LYMPHS PCT: 10 %
Lymphs Abs: 1.3 10*3/uL (ref 0.7–4.0)
MCH: 33.7 pg (ref 26.0–34.0)
MCHC: 33.7 g/dL (ref 30.0–36.0)
MCV: 99.8 fL (ref 78.0–100.0)
MONO ABS: 0.9 10*3/uL (ref 0.1–1.0)
Monocytes Relative: 7 %
Neutro Abs: 11 10*3/uL — ABNORMAL HIGH (ref 1.7–7.7)
Neutrophils Relative %: 82 %
PLATELETS: 211 10*3/uL (ref 150–400)
RBC: 4.16 MIL/uL — AB (ref 4.22–5.81)
RDW: 13.5 % (ref 11.5–15.5)
WBC: 13.4 10*3/uL — AB (ref 4.0–10.5)

## 2016-02-24 LAB — URINALYSIS, ROUTINE W REFLEX MICROSCOPIC
Bilirubin Urine: NEGATIVE
GLUCOSE, UA: NEGATIVE mg/dL
KETONES UR: NEGATIVE mg/dL
LEUKOCYTES UA: NEGATIVE
NITRITE: NEGATIVE
PROTEIN: NEGATIVE mg/dL
Specific Gravity, Urine: <1.005 — ABNORMAL LOW (ref 1.005–1.030)
pH: 6.5 (ref 5.0–8.0)

## 2016-02-24 LAB — TROPONIN I: Troponin I: <0.03 ng/mL (ref <0.03)

## 2016-02-24 LAB — TSH: TSH: 0.734 u[IU]/mL (ref 0.350–4.500)

## 2016-02-24 MED ORDER — LEVOTHYROXINE SODIUM 112 MCG PO TABS
112.0000 ug | ORAL_TABLET | Freq: Every day | ORAL | Status: DC
  Administered 2016-02-26 – 2016-02-27 (×2): 112 ug via ORAL
  Filled 2016-02-24 (×3): qty 1

## 2016-02-24 MED ORDER — ONDANSETRON HCL 4 MG PO TABS: 4.0000 mg | ORAL_TABLET | Freq: Four times a day (QID) | ORAL | Status: DC | PRN

## 2016-02-24 MED ORDER — DILTIAZEM HCL 25 MG/5ML IV SOLN
10.0000 mg | Freq: Once | INTRAVENOUS | Status: DC
  Filled 2016-02-24: qty 5

## 2016-02-24 MED ORDER — ACETAMINOPHEN 650 MG RE SUPP: 650.0000 mg | Freq: Four times a day (QID) | RECTAL | Status: DC | PRN

## 2016-02-24 MED ORDER — ACETAMINOPHEN 325 MG PO TABS: 650.0000 mg | ORAL_TABLET | Freq: Four times a day (QID) | ORAL | Status: DC | PRN

## 2016-02-24 MED ORDER — ONDANSETRON HCL 4 MG/2ML IJ SOLN: 4.0000 mg | Freq: Four times a day (QID) | INTRAMUSCULAR | Status: DC | PRN

## 2016-02-24 MED ORDER — SODIUM CHLORIDE 0.9 % IV SOLN: INTRAVENOUS | Status: DC

## 2016-02-24 MED ORDER — IOPAMIDOL (ISOVUE-300) INJECTION 61%
INTRAVENOUS | Status: AC
  Filled 2016-02-24: qty 100

## 2016-02-24 MED ORDER — SODIUM CHLORIDE 0.9 % IV SOLN
INTRAVENOUS | Status: DC
  Administered 2016-02-24: 19:00:00 via INTRAVENOUS

## 2016-02-24 MED ORDER — IPRATROPIUM BROMIDE 0.02 % IN SOLN
0.5000 mg | Freq: Four times a day (QID) | RESPIRATORY_TRACT | Status: DC
  Administered 2016-02-24 – 2016-02-25 (×2): 0.5 mg via RESPIRATORY_TRACT
  Filled 2016-02-24 (×2): qty 2.5

## 2016-02-24 MED ORDER — SODIUM CHLORIDE 0.9 % IV BOLUS (SEPSIS)
500.0000 mL | Freq: Once | INTRAVENOUS | Status: AC
  Administered 2016-02-24: 500 mL via INTRAVENOUS

## 2016-02-24 MED ORDER — SODIUM CHLORIDE 0.9% FLUSH
3.0000 mL | Freq: Two times a day (BID) | INTRAVENOUS | Status: DC
  Administered 2016-02-24 – 2016-02-27 (×4): 3 mL via INTRAVENOUS

## 2016-02-24 MED ORDER — MEMANTINE HCL 5 MG PO TABS
10.0000 mg | ORAL_TABLET | Freq: Two times a day (BID) | ORAL | Status: DC
  Administered 2016-02-24 – 2016-02-27 (×5): 10 mg via ORAL
  Filled 2016-02-24 (×6): qty 2

## 2016-02-24 MED ORDER — METOPROLOL SUCCINATE ER 50 MG PO TB24
50.0000 mg | ORAL_TABLET | Freq: Every day | ORAL | Status: DC
  Administered 2016-02-24 – 2016-02-26 (×3): 50 mg via ORAL
  Filled 2016-02-24 (×3): qty 1

## 2016-02-24 MED ORDER — K PHOS MONO-SOD PHOS DI & MONO 155-852-130 MG PO TABS
500.0000 mg | ORAL_TABLET | Freq: Every day | ORAL | Status: DC
  Administered 2016-02-24 – 2016-02-27 (×4): 500 mg via ORAL
  Filled 2016-02-24 (×4): qty 2

## 2016-02-24 MED ORDER — HYDROCODONE-ACETAMINOPHEN 5-325 MG PO TABS
1.0000 | ORAL_TABLET | ORAL | Status: DC | PRN
  Administered 2016-02-26 – 2016-02-27 (×4): 1 via ORAL
  Filled 2016-02-24 (×4): qty 1

## 2016-02-24 MED ORDER — POLYETHYLENE GLYCOL 3350 17 G PO PACK: 17.0000 g | PACK | Freq: Every day | ORAL | Status: DC | PRN

## 2016-02-24 NOTE — ED Notes (Signed)
Pt. Placed back in pelvic binding at this time.

## 2016-02-24 NOTE — ED Notes (Signed)
Patient moved up in bed with RN  Denny PeonErin. Patient comfortable at this time.

## 2016-02-24 NOTE — H&P (Signed)
History and Physical        Hospital Admission Note Date: 02/24/2016  Patient name: Philip Morse Medical record number: [id number] Date of birth: February 21, 1926 Age: 82 y.o. Gender: male  PCP: Eartha Inch, MD   Referring physician: Arvilla Meres, PA  Patient coming from: home    Chief Complaint:  Fall with pubic rami fracture  HPI: Patient is a 82 year old male with history of atrial fibrillation on a xarelto, hypertension, hyperlipidemia, dementia presented to ED with multiple falls from home. History was obtained from his wife, patient is a poor historian due to dementia. Per his wife, he fell, described as "rolled from his bed" around 2 AM in the morning. She had to call for assistance to get patient back into the bed. Subsequently this morning when he woke up and got out of the bed. He was walking with assistance of his walker when his right knee gave out and patient fell on his right side and tripped over his walker. Per wife, he had no dizziness, lightheadedness or syncopal episode. No chest pain or shortness of breath. She states that he did hit his head. EMS was called and patient was brought to ED, placed in C-spine. Patient complaint of right leg pain radiating to right hip, unable to lift his right leg, difficult to put weight on his right foot. At the time of my examination during the encounter, patient in Afib RVR, HR 130's ED work-up/course:  In ED, afebrile, heart rate initially was in 50s however now has been persisting atrial fibrillation with RVR 125-138. BP 147/98, respiratory rate 18 and O2 sats 96% UA negative for UTI CT abdomen showed no acute intra-abdominal or intrapelvic abnormalities. Nondisplaced fracture right superior inferior pubic rami with associated suprapubic hematoma/infiltrative changes in the pre-vesicle space. Severe spinal stenosis at T12-L1  and L1-L2 and L2-L3 CT head, C-spine normal   Review of Systems: Positives marked in 'bold' Constitutional: Denies fever, chills, diaphoresis, poor appetite and fatigue.  HEENT: Denies photophobia, eye pain, redness, hearing loss, ear pain, congestion, sore throat, rhinorrhea, sneezing, mouth sores, trouble swallowing, neck pain, neck stiffness and tinnitus.   Respiratory: Denies SOB, DOE, cough, chest tightness,  and wheezing.   Cardiovascular: Denies chest pain, leg swelling. + palpitation  Gastrointestinal: Denies nausea, vomiting, abdominal pain, diarrhea, constipation, blood in stool and abdominal distention.  Genitourinary: Denies dysuria, urgency, frequency, hematuria, flank pain and difficulty urinating.  Musculoskeletal: Please see history of present illness  Skin: Denies pallor, rash and wound.  Neurological: Denies dizziness, seizures, syncope, weakness, light-headedness, numbness and headaches.  Hematological: Denies adenopathy. Easy bruising, personal or family bleeding history  Psychiatric/Behavioral: Denies suicidal ideation, mood changes, confusion, nervousness, sleep disturbance and agitation  Past Medical History: Past Medical History:  Diagnosis Date  . Atrial fibrillation (HCC)   . Dementia   . HLD (hyperlipidemia)   . HTN (hypertension)     History reviewed. No pertinent surgical history.  Medications: Prior to Admission medications   Medication Sig Start Date End Date Taking? Authorizing Provider  guaiFENesin (MUCINEX) 600 MG 12 hr tablet Take 1 tablet (600 mg total) by mouth 2 (two) times daily as needed for cough or to  loosen phlegm. 01/17/16  Yes Barnetta Chapel, MD  levothyroxine (SYNTHROID, LEVOTHROID) 112 MCG tablet Take 112 mcg by mouth daily before breakfast.   Yes Historical Provider, MD  memantine (NAMENDA) 10 MG tablet Take 10 mg by mouth 2 (two) times daily.   Yes Historical Provider, MD  metoprolol succinate (TOPROL-XL) 50 MG 24 hr tablet Take 50  mg by mouth daily. Take with or immediately following a meal.   Yes Historical Provider, MD  phosphorus (PHOSPHA 250 NEUTRAL) 155-852-130 MG tablet Take 500 mg by mouth daily.   Yes Historical Provider, MD  rivaroxaban (XARELTO) 20 MG TABS tablet Take 20 mg by mouth daily with supper.   Yes Historical Provider, MD  torsemide (DEMADEX) 20 MG tablet Take 20 mg by mouth daily.   Yes Historical Provider, MD    Allergies:  No Known Allergies  Social History:  reports that he has never smoked. He has never used smokeless tobacco. He reports that he does not drink alcohol or use drugs.Patient currently lives at home with his wife   Family History: Family history difficult to obtain from the patient due to his dementia  Physical Exam: Blood pressure 147/98, pulse (!) 56, temperature 98.9 F (37.2 C), temperature source Oral, resp. rate 18, height 6\' 2"  (1.88 m), weight 106.1 kg (234 lb), SpO2 96 %. General: Alert, awake, oriented x2, poor historian  HEENT: normocephalic, atraumatic, anicteric sclera, pink conjunctiva, pupils equal and reactive to light and accomodation, oropharynx clear Neck: supple, no masses or lymphadenopathy, no goiter, no bruits  Heart: Tachycardiac, irregularly irregular  Lungs: Clear to auscultation bilaterally, no wheezing, rales or rhonchi. Abdomen: Soft, mild diffuse tenderness, nondistended, positive bowel sounds, no masses. Extremities: No clubbing, cyanosis or edema with positive pedal pulses. Neuro: Grossly intact, no focal neurological deficits, strength 5/5 upper and lower extremities bilaterally, pain on lifting his right leg Psych: dementia with confusion Skin: no rashes or lesions, warm and dry   LABS on Admission:  Basic Metabolic Panel:  Recent Labs Lab 02/24/16 1041  NA 141  K 3.9  CL 103  CO2 27  GLUCOSE 111*  BUN 16  CREATININE 1.04  CALCIUM 10.1   Liver Function Tests:  Recent Labs Lab 02/24/16 1041  AST 28  ALT 30  ALKPHOS 89    BILITOT 1.1  PROT 7.9  ALBUMIN 3.7    Recent Labs Lab 02/24/16 1041  LIPASE 65*   No results for input(s): AMMONIA in the last 168 hours. CBC:  Recent Labs Lab 02/24/16 1041  WBC 13.4*  NEUTROABS 11.0*  HGB 14.0  HCT 41.5  MCV 99.8  PLT 211   Cardiac Enzymes: No results for input(s): CKTOTAL, CKMB, CKMBINDEX, TROPONINI in the last 168 hours. BNP: Invalid input(s): POCBNP CBG: No results for input(s): GLUCAP in the last 168 hours.  Radiological Exams on Admission:  No results found.  *I have personally reviewed the images above*  EKG: None available   Assessment/Plan Principal Problem:   Atrial fibrillation with RVR (HCC): RVR precipitated likely due to pain, anxiety - Admit to telemetry, obtain serial cardiac enzymes, TSH - Placed on IV fluid hydration, hold Demadex today, Cardizem 10 mg IV x1 now - Hold anticoagulation given pubic rami fracture and hematoma, not a good candidate for anticoagulation given multiple falls - Restart beta blocker   Active Problems:   Fracture of pubic rami, right - Orthopedics was consulted, ED he spoke with Dr. Linna Caprice, recommended conservative management, weightbearing with walker, follow up outpatient  and discontinue anticoagulation    Falls - Patient had 2 falls today and having multiple falls recently, not a good candidate for anticoagulation at this time - PTOT evaluation    HTN (hypertension) - BP somewhat uncontrolled at this time likely due to anxiety, pain from the fracture - Cardizem 10 mg IV 1 and restart beta blocker  Hypothyroidism - Obtain TSH, continue Synthroid    Dementia - Continue Namenda   DVT prophylaxis: SCDs  CODE STATUS: Full CODE STATUS, discussed with patient's wife  Consults called: EDP had called orthopedics  Family Communication: Admission, patients condition and plan of care including tests being ordered have been discussed with the patient and wife who indicates understanding and  agree with the plan and Code Status  Admission status: Inpatient, telemetry  Disposition plan: Further plan will depend as patient's clinical course evolves and further radiologic and laboratory data become available.   At the time of admission, it appears that the appropriate admission status for this patient is INPATIENT . This is judged to be reasonable and necessary in order to provide the required intensity of service to ensure the patient's safety given the presenting symptoms, physical exam findings, and initial radiographic and laboratory data in the context of their chronic comorbidities.  The following factors support the admission status of inpatient:   A. The patient's presenting symptoms and multiple falls, pubic rami fracture, atrial fibrillation with RVR B. The worrisome physical exam findings include abdominal tenderness, atrial fibrillation with RVR C. The initial radiographic and laboratory data are worrisome because of pubic rami fracture, hematoma D. The chronic co-morbidities include hypertension, hyperlipidemia, dementia, hypothyroidism E. I certify that at the point of admission it is my clinical judgment that the patient will require inpatient hospital care spanning beyond 2 midnights from the point of admission.    Time Spent on Admission: 60 minute  Alzada Brazee M.D. [facility] 02/24/2016, 4:28 PM Pager: [phone]  If 7PM-7AM, please contact night-coverage [email or web address] Password [email or web address]

## 2016-02-24 NOTE — Consult Note (Signed)
Philip Morse is an 82 y.o. male.    Chief Complaint: right hip pain  HPI: 82 y/o male with ground level fall x 2 earlier this morning. Pt "rolled out of bed" onto right side and hip. C/o immediate pain to right hip/pelvis with inability to bear weight or lift right lower extremity. Pt currently a resident at [facility] and would like to return there. C/o mild pain to right shoulder with lifting right arm. Denies any LOC or other injuries. Denies any numbness or tingling distally. Pt on xarelto for a-fib.  PCP:  Chesley Noon, MD  PMH: Past Medical History:  Diagnosis Date  . Atrial fibrillation ([place])   . Dementia   . HLD (hyperlipidemia)   . HTN (hypertension)     PSH: History reviewed. No pertinent surgical history.  Social History:  reports that he has never smoked. He has never used smokeless tobacco. He reports that he does not drink alcohol or use drugs.  Allergies:  No Known Allergies  Medications: Current Facility-Administered Medications  Medication Dose Route Frequency Provider Last Rate Last Dose  . 0.9 %  sodium chloride infusion   Intravenous Continuous Ripudeep K Rai, MD      . 0.9 %  sodium chloride infusion   Intravenous Continuous Ripudeep Krystal Eaton, MD 75 mL/hr at 02/24/16 1906    . acetaminophen (TYLENOL) tablet 650 mg  650 mg Oral Q6H PRN Ripudeep Krystal Eaton, MD       Or  . acetaminophen (TYLENOL) suppository 650 mg  650 mg Rectal Q6H PRN Ripudeep Krystal Eaton, MD      . diltiazem (CARDIZEM) injection 10 mg  10 mg Intravenous Once Ripudeep K Rai, MD      . HYDROcodone-acetaminophen (NORCO/VICODIN) 5-325 MG per tablet 1-2 tablet  1-2 tablet Oral Q4H PRN Ripudeep K Rai, MD      . iopamidol (ISOVUE-300) 61 % injection           . ipratropium (ATROVENT) nebulizer solution 0.5 mg  0.5 mg Nebulization Q6H Ripudeep Krystal Eaton, MD      . Derrill Memo ON 02/25/2016] levothyroxine (SYNTHROID, LEVOTHROID) tablet 112 mcg  112 mcg Oral QAC breakfast Ripudeep K Rai, MD      . memantine  [facility]) tablet 10 mg  10 mg Oral BID Ripudeep K Rai, MD      . metoprolol succinate (TOPROL-XL) 24 hr tablet 50 mg  50 mg Oral Q supper Ripudeep Krystal Eaton, MD   50 mg at 02/24/16 1929  . ondansetron (ZOFRAN) tablet 4 mg  4 mg Oral Q6H PRN Ripudeep K Rai, MD       Or  . ondansetron (ZOFRAN) injection 4 mg  4 mg Intravenous Q6H PRN Ripudeep K Rai, MD      . phosphorus (K PHOS NEUTRAL) tablet 500 mg  500 mg Oral Daily Ripudeep K Rai, MD   500 mg at 02/24/16 1930  . polyethylene glycol (MIRALAX / GLYCOLAX) packet 17 g  17 g Oral Daily PRN Ripudeep K Rai, MD      . sodium chloride flush (NS) 0.9 % injection 3 mL  3 mL Intravenous Q12H Ripudeep K Rai, MD        Results for orders placed or performed during the hospital encounter of 02/24/16 (from the past 48 hour(s))  Comprehensive metabolic panel     Status: Abnormal   Collection Time: 02/24/16 10:41 AM  Result Value Ref Range   Sodium 141 135 - 145 mmol/L   Potassium  3.9 3.5 - 5.1 mmol/L   Chloride 103 101 - 111 mmol/L   CO2 27 22 - 32 mmol/L   Glucose, Bld 111 (H) 65 - 99 mg/dL   BUN 16 6 - 20 mg/dL   Creatinine, Ser 1.04 0.61 - 1.24 mg/dL   Calcium 10.1 8.9 - 10.3 mg/dL   Total Protein 7.9 6.5 - 8.1 g/dL   Albumin 3.7 3.5 - 5.0 g/dL   AST 28 15 - 41 U/L   ALT 30 17 - 63 U/L   Alkaline Phosphatase 89 38 - 126 U/L   Total Bilirubin 1.1 0.3 - 1.2 mg/dL   GFR calc non Af Amer >60 >60 mL/min   GFR calc Af Amer >60 >60 mL/min    Comment: (NOTE) The eGFR has been calculated using the CKD EPI equation. This calculation has not been validated in all clinical situations. eGFR's persistently <60 mL/min signify possible Chronic Kidney Disease.    Anion gap 11 5 - 15  Lipase, blood     Status: Abnormal   Collection Time: 02/24/16 10:41 AM  Result Value Ref Range   Lipase 65 (H) 11 - 51 U/L  CBC with Differential     Status: Abnormal   Collection Time: 02/24/16 10:41 AM  Result Value Ref Range   WBC 13.4 (H) 4.0 - 10.5 K/uL   RBC 4.16 (L)  4.22 - 5.81 MIL/uL   Hemoglobin 14.0 13.0 - 17.0 g/dL   HCT 41.5 39.0 - 52.0 %   MCV 99.8 78.0 - 100.0 fL   MCH 33.7 26.0 - 34.0 pg   MCHC 33.7 30.0 - 36.0 g/dL   RDW 13.5 11.5 - 15.5 %   Platelets 211 150 - 400 K/uL   Neutrophils Relative % 82 %   Neutro Abs 11.0 (H) 1.7 - 7.7 K/uL   Lymphocytes Relative 10 %   Lymphs Abs 1.3 0.7 - 4.0 K/uL   Monocytes Relative 7 %   Monocytes Absolute 0.9 0.1 - 1.0 K/uL   Eosinophils Relative 1 %   Eosinophils Absolute 0.1 0.0 - 0.7 K/uL   Basophils Relative 0 %   Basophils Absolute 0.0 0.0 - 0.1 K/uL  Urinalysis, Routine w reflex microscopic     Status: Abnormal   Collection Time: 02/24/16  1:28 PM  Result Value Ref Range   Color, Urine YELLOW YELLOW   APPearance CLEAR CLEAR   Specific Gravity, Urine <1.005 (L) 1.005 - 1.030   pH 6.5 5.0 - 8.0   Glucose, UA NEGATIVE NEGATIVE mg/dL   Hgb urine dipstick TRACE (A) NEGATIVE   Bilirubin Urine NEGATIVE NEGATIVE   Ketones, ur NEGATIVE NEGATIVE mg/dL   Protein, ur NEGATIVE NEGATIVE mg/dL   Nitrite NEGATIVE NEGATIVE   Leukocytes, UA NEGATIVE NEGATIVE  Urine microscopic-add on     Status: Abnormal   Collection Time: 02/24/16  1:28 PM  Result Value Ref Range   Squamous Epithelial / LPF 0-5 (A) NONE SEEN   WBC, UA 0-5 0 - 5 WBC/hpf   RBC / HPF 0-5 0 - 5 RBC/hpf   Bacteria, UA RARE (A) NONE SEEN  Troponin I     Status: None   Collection Time: 02/24/16  6:57 PM  Result Value Ref Range   Troponin I <0.03 <0.03 ng/mL   Dg Tibia/fibula Right  Result Date: 02/24/2016 CLINICAL DATA:  Status post fall.  Right leg pain. EXAM: RIGHT TIBIA AND FIBULA - 2 VIEW COMPARISON:  None. FINDINGS: Generalized osteopenia. No acute fracture or dislocation. No  periosteal reaction or bone destruction. Peripheral vascular atherosclerotic disease. No soft tissue abnormality. IMPRESSION: No acute osseous injury of the right tibia or fibula. Electronically Signed   By: Kathreen Devoid   On: 02/24/2016 11:28   Ct Head Wo  Contrast  Result Date: 02/24/2016 CLINICAL DATA:  Numerous recent falls at nursing home. Complains of right-sided headache. Patient is on anticoagulation. EXAM: CT HEAD WITHOUT CONTRAST CT CERVICAL SPINE WITHOUT CONTRAST TECHNIQUE: Multidetector CT imaging of the head and cervical spine was performed following the standard protocol without intravenous contrast. Multiplanar CT image reconstructions of the cervical spine were also generated. COMPARISON:  None. FINDINGS: CT HEAD FINDINGS Brain: Mild cerebral atrophy. Low-density in the periventricular matter suggesting chronic changes. No evidence for acute hemorrhage, mass lesion, midline shift, hydrocephalus or large infarct. Vascular: No hyperdense vessel or unexpected calcification. Skull: No calvarial fracture Sinuses/Orbits: Deformity of the nasal septum with the apex towards the left. This may be related to old injury. Mild mucosal disease in the maxillary sinuses. Mastoids are clear. Other: Mild swelling on the right side of the scalp. CT CERVICAL SPINE FINDINGS Alignment: Alignment is within normal limits. Skull base and vertebrae: Negative for an acute fracture or dislocation. Diffuse osteopenia. Soft tissues and spinal canal: No prevertebral fluid or swelling. No visible canal hematoma. Disc levels: Disc space narrowing and C2-C3. Intervertebral body fusion at C3-C4. Complete loss of the disc space with ankylosis at C5-C6. Severe disc space narrowing at C6-C7. Multilevel degenerative facet disease. Facet ankylosis bilaterally at C5-C6. Upper chest: No pneumothorax. Other: None IMPRESSION: CT head: No acute intracranial abnormality. Atrophy with chronic small vessel ischemic changes. CT cervical spine: Multilevel degenerative disease as described. No acute bone abnormality in cervical spine. Electronically Signed   By: Markus Daft M.D.   On: 02/24/2016 12:22   Ct Cervical Spine Wo Contrast  Result Date: 02/24/2016 CLINICAL DATA:  Numerous recent falls at  nursing home. Complains of right-sided headache. Patient is on anticoagulation. EXAM: CT HEAD WITHOUT CONTRAST CT CERVICAL SPINE WITHOUT CONTRAST TECHNIQUE: Multidetector CT imaging of the head and cervical spine was performed following the standard protocol without intravenous contrast. Multiplanar CT image reconstructions of the cervical spine were also generated. COMPARISON:  None. FINDINGS: CT HEAD FINDINGS Brain: Mild cerebral atrophy. Low-density in the periventricular matter suggesting chronic changes. No evidence for acute hemorrhage, mass lesion, midline shift, hydrocephalus or large infarct. Vascular: No hyperdense vessel or unexpected calcification. Skull: No calvarial fracture Sinuses/Orbits: Deformity of the nasal septum with the apex towards the left. This may be related to old injury. Mild mucosal disease in the maxillary sinuses. Mastoids are clear. Other: Mild swelling on the right side of the scalp. CT CERVICAL SPINE FINDINGS Alignment: Alignment is within normal limits. Skull base and vertebrae: Negative for an acute fracture or dislocation. Diffuse osteopenia. Soft tissues and spinal canal: No prevertebral fluid or swelling. No visible canal hematoma. Disc levels: Disc space narrowing and C2-C3. Intervertebral body fusion at C3-C4. Complete loss of the disc space with ankylosis at C5-C6. Severe disc space narrowing at C6-C7. Multilevel degenerative facet disease. Facet ankylosis bilaterally at C5-C6. Upper chest: No pneumothorax. Other: None IMPRESSION: CT head: No acute intracranial abnormality. Atrophy with chronic small vessel ischemic changes. CT cervical spine: Multilevel degenerative disease as described. No acute bone abnormality in cervical spine. Electronically Signed   By: Markus Daft M.D.   On: 02/24/2016 12:22   Ct Abdomen Pelvis W Contrast  Result Date: 02/24/2016 CLINICAL DATA:  Multiple falls,  soft tissue hematoma, RIGHT hip pain, abdominal pain, dementia, hypertension, atrial  fibrillation, on Xarelto EXAM: CT ABDOMEN AND PELVIS WITH CONTRAST TECHNIQUE: Multidetector CT imaging of the abdomen and pelvis was performed using the standard protocol following bolus administration of intravenous contrast. Sagittal and coronal MPR images reconstructed from axial data set. CONTRAST:  80 cc Isovue 300 IV.  Oral contrast was not administered. COMPARISON:  01/14/2016 FINDINGS: Lower chest: Bibasilar atelectasis. Peripheral rounded opacity LEFT lower lobe 20 x 14 mm image 14, 18 x 13 mm on previous exam, uncertain if represents nodule or rounded atelectasis. Hepatobiliary: Gallbladder and liver normal appearance Pancreas: Normal appearance Spleen: Normal appearance Adrenals/Urinary Tract: Adrenal glands normal appearance. Tiny nonobstructing LEFT renal calculus. Small BILATERAL renal cysts. Unremarkable ureters. Few small bladder diverticula. Mild prostatic enlargement, gland 5.3 x 3.9 cm image 88. Stomach/Bowel: Appendix not visualized. Diverticulosis of sigmoid colon without evidence of diverticulitis. Tiny hiatal hernia. Stomach and bowel loops otherwise normal appearance for exam limited by lack of oral contrast and adequate distention Vascular/Lymphatic: Atherosclerotic calcifications aorta and coronary arteries. Aorta normal caliber. No adenopathy. Reproductive: N/A Other: Infiltrative changes superior to the pubic symphysis and superior pubic rami likely representing hemorrhage within the space of Retzius. No free air or free fluid. Tiny umbilical hernia containing fat. Musculoskeletal: Nondisplaced fractures of the RIGHT superior and inferior pubic rami, likely accounting for the suprapubic hematoma. Cannot completely exclude avulsion injury at the rectus abdominis insertions. Advanced degenerative changes of both hip joints. Osseous demineralization with multilevel degenerative disc and facet disease changes of the thoracolumbar spine. Severe multifactorial central acquired spinal stenosis  at L1-L2, slightly less severe at T12-L1 and L2-L3. IMPRESSION: No acute intra-abdominal or intrapelvic abnormalities. Mild prostatic enlargement with note of multiple small bladder diverticula. Tiny nonobstructing LEFT renal calculus. Sigmoid diverticulosis. Nondisplaced fractures RIGHT superior and inferior pubic rami with associated suprapubic hematoma/infiltrative changes in the prevesical space, difficult to entirely exclude coexistent injuries at the rectus abdominis insertions. Multilevel severe central acquired spinal stenosis T12-L1, L1-L2, and L2-L3 as above. Aortic atherosclerosis and coronary arterial calcification. Electronically Signed   By: Lavonia Dana M.D.   On: 02/24/2016 12:28   Dg Chest Port 1 View  Result Date: 02/24/2016 CLINICAL DATA:  Shortness of breath and cough EXAM: PORTABLE CHEST 1 VIEW COMPARISON:  None. FINDINGS: Cardiac shadow is within normal limits. Aortic calcifications are seen. The lungs are hypoinflated but clear. No acute bony abnormality is seen. IMPRESSION: Aortic atherosclerosis. No acute abnormality noted. Electronically Signed   By: Inez Catalina M.D.   On: 02/24/2016 15:24   Dg Hip Unilat With Pelvis 2-3 Views Right  Result Date: 02/24/2016 CLINICAL DATA:  Status post fall.  Right hip pain. EXAM: DG HIP (WITH OR WITHOUT PELVIS) 2-3V RIGHT COMPARISON:  None. FINDINGS: Severe osteopenia. No acute fracture or dislocation. Severe osteoarthritis of the right hip with joint space narrowing, marginal osteophytosis and subchondral sclerosis. Moderate osteoarthritis of the left hip. Osteoarthritis of bilateral sacroiliac joints. Degenerative disc disease with disc height loss at L4-5 and L5-S1. IMPRESSION: 1. No acute osseous injury of the right hip. Given the patient's age and osteopenia, if there is persistent clinical concern for an occult hip fracture, then a MRI of the hip is recommended for increased sensitivity. 2. Severe osteoarthritis of the right hip.  Electronically Signed   By: Kathreen Devoid   On: 02/24/2016 11:27   Dg Femur Min 2 Views Right  Result Date: 02/24/2016 CLINICAL DATA:  Status post fall.  Right  hip pain. EXAM: RIGHT FEMUR 2 VIEWS COMPARISON:  None. FINDINGS: Generalized osteopenia. No fracture or dislocation. Severe osteoarthritis of the right hip joint space narrowing, marginal osteophytosis and subchondral sclerosis. No soft tissue abnormality. IMPRESSION: No acute osseous injury of the right femur. Given the patient's age and osteopenia, if there is persistent clinical concern for an occult hip fracture, a MRI of the hip is recommended for increased sensitivity. Electronically Signed   By: Kathreen Devoid   On: 02/24/2016 11:30    ROS: ROS Unable to lift right leg without pain Inability to walk  Physical Exam: Alert and appropriate 82 y/o male in no acute distress Cervical spine with full rom, no tenderness Mild pain with active flexion of right upper extremity but no pain with passive rom No signs of fracture/deformity to bilateral upper extremities nv intact distally Right pelvis pain with palpation and pain with rotation of the right leg Left lower extremity full rom without pain, no deformity nv intact distally bilaterally Physical Exam   Assessment/Plan Assessment: right superior and inferior pubic rami fractures, non displaced   Plan: Pt admitted to medical team for management Pelvis fractures are non operative  Recommend formal physical therapy while in the hospital to work on rom and mobility Pt may bear weight on the right lower extremity but it will cause pain for several weeks Pain management as needed Will continue to monitor his progress

## 2016-02-24 NOTE — ED Provider Notes (Signed)
[facility]ENCY DEPT Provider Note   CSN: [id number] Arrival date & time: 02/24/16  7829     History   Chief Complaint Chief Complaint  Patient presents with  . Fall    on thinners    HPI Philip Morse is a 82 y.o. male.  Philip Morse is a 82 y.o. male with h/o atrial fibrillation on xarelto, HLD, HTN, and dementia presents to ED s/p fall. Per wife, patient fell at approximately 2 am this morning, they had to call for assistance to get patient back into bed. Patient was able to go back asleep; however, woke up later and got out of bed, using walker, knees "gave out" and patient fell on his right side and hit his head. No LOC. EMS was called and patient subsequently transported to ED. Patient placed in c-spine and pelvic binding. Patient complains of right leg pain that radiates to right hip, abdominal pain, and head pain. He states he is unable to lift his right leg. He denies fever, SOB, cough, CP, N/V, numbness, changes in vision, trouble swallowing, dysuria, hematuria. Patient lives in an independent living facility with wife.    Level V caveat - dementia      Past Medical History:  Diagnosis Date  . Atrial fibrillation (HCC)   . Dementia   . HLD (hyperlipidemia)   . HTN (hypertension)     Patient Active Problem List   Diagnosis Date Noted  . Fracture of pubic rami, right 02/24/2016  . Falls 02/24/2016  . Pubic ramus fracture (HCC) 02/24/2016  . HTN (hypertension)   . HLD (hyperlipidemia)   . Dementia   . Atrial fibrillation with RVR (HCC)   . Transaminitis 01/14/2016  . Leukocytosis 01/14/2016  . Nausea vomiting and diarrhea 01/14/2016    History reviewed. No pertinent surgical history.     Home Medications    Prior to Admission medications   Medication Sig Start Date End Date Taking? Authorizing Provider  guaiFENesin (MUCINEX) 600 MG 12 hr tablet Take 1 tablet (600 mg total) by mouth 2 (two) times daily as needed for cough or to loosen phlegm. 01/17/16   Yes Barnetta Chapel, MD  levothyroxine (SYNTHROID, LEVOTHROID) 112 MCG tablet Take 112 mcg by mouth daily before breakfast.   Yes Historical Provider, MD  memantine (NAMENDA) 10 MG tablet Take 10 mg by mouth 2 (two) times daily.   Yes Historical Provider, MD  metoprolol succinate (TOPROL-XL) 50 MG 24 hr tablet Take 50 mg by mouth daily. Take with or immediately following a meal.   Yes Historical Provider, MD  phosphorus (PHOSPHA 250 NEUTRAL) 155-852-130 MG tablet Take 500 mg by mouth daily.   Yes Historical Provider, MD  rivaroxaban (XARELTO) 20 MG TABS tablet Take 20 mg by mouth daily with supper.   Yes Historical Provider, MD  torsemide (DEMADEX) 20 MG tablet Take 20 mg by mouth daily.   Yes Historical Provider, MD    Family History History reviewed. No pertinent family history.  Social History Social History  Substance Use Topics  . Smoking status: Never Smoker  . Smokeless tobacco: Never Used  . Alcohol use No     Allergies   Review of patient's allergies indicates no known allergies.   Review of Systems Review of Systems  Unable to perform ROS: Dementia  Constitutional: Negative for fever.  HENT: Negative for trouble swallowing.   Eyes: Negative for visual disturbance.  Respiratory: Negative for cough and shortness of breath.   Cardiovascular: Negative for chest pain.  Gastrointestinal: Positive for abdominal pain. Negative for blood in stool, constipation, diarrhea, nausea and vomiting.  Genitourinary: Negative for dysuria and hematuria.  Musculoskeletal: Positive for arthralgias. Negative for back pain and neck pain.  Skin: Negative for rash.  Neurological: Positive for headaches. Negative for syncope and numbness.     Physical Exam Updated Vital Signs BP 162/93 (BP Location: Left Arm)   Pulse 71   Temp 98.9 F (37.2 C) (Oral)   Resp 13   Ht 6\' 2"  (1.88 m)   Wt 106.1 kg   SpO2 98%   BMI 30.04 kg/m   Physical Exam  Constitutional: He appears  well-developed and well-nourished. No distress.  HENT:  Head: Normocephalic and atraumatic. Head is without raccoon's eyes and without Battle's sign.  Mouth/Throat: Uvula is midline, oropharynx is clear and moist and mucous membranes are normal. No trismus in the jaw. No oropharyngeal exudate.  No battle sign or raccoon eyes.   Eyes: Conjunctivae and EOM are normal. Pupils are equal, round, and reactive to light. Right eye exhibits no discharge. Left eye exhibits no discharge. No scleral icterus.  Neck: Normal range of motion and phonation normal. Neck supple. No neck rigidity. Normal range of motion present.  Neck ROM intact.   Cardiovascular: Normal rate, normal heart sounds and intact distal pulses.  An irregularly irregular rhythm present.  No murmur heard. Pulmonary/Chest: Effort normal and breath sounds normal. No stridor. No respiratory distress. He has no wheezes. He has no rales.  Abdominal: Soft. Bowel sounds are normal. He exhibits no distension. There is generalized tenderness. There is guarding. There is no rigidity, no rebound and no CVA tenderness.  Diffuse abdominal tenderness with mild guarding. No bruising noted.   Musculoskeletal: Normal range of motion.  Patient reports unable to lift right leg. Cannot ambulate. Mild external rotation of right extremity. 2+ DP pulses.   Lymphadenopathy:    He has no cervical adenopathy.  Neurological: He is alert. He is not disoriented. Coordination and gait normal. GCS eye subscore is 4. GCS verbal subscore is 5. GCS motor subscore is 6.  Mental Status:  Alert, thought content appropriate. Speech fluent without evidence of aphasia. Able to follow 2 step commands without difficulty.  Cranial Nerves:  II:  Peripheral visual fields grossly normal, pupils equal, round, reactive to light III,IV, VI: ptosis not present, extra-ocular motions intact bilaterally  V,VII: smile symmetric, facial light touch sensation equal VIII: hearing grossly  normal to voice  X: uvula elevates symmetrically  XI: bilateral shoulder shrug symmetric and strong XII: midline tongue extension without fassiculations Motor:  Normal tone.  Strength: Deferred Sensory: light touch normal in all extremities. Cerebellar: deferred Gait: deferred CV: distal pulses palpable throughout   Skin: Skin is warm and dry. He is not diaphoretic.  Psychiatric: He has a normal mood and affect. His behavior is normal.     ED Treatments / Results  Labs (all labs ordered are listed, but only abnormal results are displayed) Labs Reviewed  COMPREHENSIVE METABOLIC PANEL - Abnormal; Notable for the following:       Result Value   Glucose, Bld 111 (*)    All other components within normal limits  LIPASE, BLOOD - Abnormal; Notable for the following:    Lipase 65 (*)    All other components within normal limits  CBC WITH DIFFERENTIAL/PLATELET - Abnormal; Notable for the following:    WBC 13.4 (*)    RBC 4.16 (*)    Neutro Abs 11.0 (*)  All other components within normal limits  URINALYSIS, ROUTINE W REFLEX MICROSCOPIC (NOT AT [facility]) - Abnormal; Notable for the following:    Specific Gravity, Urine <1.005 (*)    Hgb urine dipstick TRACE (*)    All other components within normal limits  URINE MICROSCOPIC-ADD ON - Abnormal; Notable for the following:    Squamous Epithelial / LPF 0-5 (*)    Bacteria, UA RARE (*)    All other components within normal limits    EKG  EKG Interpretation None       Radiology Dg Tibia/fibula Right  Result Date: 02/24/2016 CLINICAL DATA:  Status post fall.  Right leg pain. EXAM: RIGHT TIBIA AND FIBULA - 2 VIEW COMPARISON:  None. FINDINGS: Generalized osteopenia. No acute fracture or dislocation. No periosteal reaction or bone destruction. Peripheral vascular atherosclerotic disease. No soft tissue abnormality. IMPRESSION: No acute osseous injury of the right tibia or fibula. Electronically Signed   By: Elige Ko   On: 02/24/2016  11:28   Ct Head Wo Contrast  Result Date: 02/24/2016 CLINICAL DATA:  Numerous recent falls at nursing home. Complains of right-sided headache. Patient is on anticoagulation. EXAM: CT HEAD WITHOUT CONTRAST CT CERVICAL SPINE WITHOUT CONTRAST TECHNIQUE: Multidetector CT imaging of the head and cervical spine was performed following the standard protocol without intravenous contrast. Multiplanar CT image reconstructions of the cervical spine were also generated. COMPARISON:  None. FINDINGS: CT HEAD FINDINGS Brain: Mild cerebral atrophy. Low-density in the periventricular matter suggesting chronic changes. No evidence for acute hemorrhage, mass lesion, midline shift, hydrocephalus or large infarct. Vascular: No hyperdense vessel or unexpected calcification. Skull: No calvarial fracture Sinuses/Orbits: Deformity of the nasal septum with the apex towards the left. This may be related to old injury. Mild mucosal disease in the maxillary sinuses. Mastoids are clear. Other: Mild swelling on the right side of the scalp. CT CERVICAL SPINE FINDINGS Alignment: Alignment is within normal limits. Skull base and vertebrae: Negative for an acute fracture or dislocation. Diffuse osteopenia. Soft tissues and spinal canal: No prevertebral fluid or swelling. No visible canal hematoma. Disc levels: Disc space narrowing and C2-C3. Intervertebral body fusion at C3-C4. Complete loss of the disc space with ankylosis at C5-C6. Severe disc space narrowing at C6-C7. Multilevel degenerative facet disease. Facet ankylosis bilaterally at C5-C6. Upper chest: No pneumothorax. Other: None IMPRESSION: CT head: No acute intracranial abnormality. Atrophy with chronic small vessel ischemic changes. CT cervical spine: Multilevel degenerative disease as described. No acute bone abnormality in cervical spine. Electronically Signed   By: Richarda Overlie M.D.   On: 02/24/2016 12:22   Ct Cervical Spine Wo Contrast  Result Date: 02/24/2016 CLINICAL DATA:   Numerous recent falls at nursing home. Complains of right-sided headache. Patient is on anticoagulation. EXAM: CT HEAD WITHOUT CONTRAST CT CERVICAL SPINE WITHOUT CONTRAST TECHNIQUE: Multidetector CT imaging of the head and cervical spine was performed following the standard protocol without intravenous contrast. Multiplanar CT image reconstructions of the cervical spine were also generated. COMPARISON:  None. FINDINGS: CT HEAD FINDINGS Brain: Mild cerebral atrophy. Low-density in the periventricular matter suggesting chronic changes. No evidence for acute hemorrhage, mass lesion, midline shift, hydrocephalus or large infarct. Vascular: No hyperdense vessel or unexpected calcification. Skull: No calvarial fracture Sinuses/Orbits: Deformity of the nasal septum with the apex towards the left. This may be related to old injury. Mild mucosal disease in the maxillary sinuses. Mastoids are clear. Other: Mild swelling on the right side of the scalp. CT CERVICAL SPINE FINDINGS Alignment: Alignment  is within normal limits. Skull base and vertebrae: Negative for an acute fracture or dislocation. Diffuse osteopenia. Soft tissues and spinal canal: No prevertebral fluid or swelling. No visible canal hematoma. Disc levels: Disc space narrowing and C2-C3. Intervertebral body fusion at C3-C4. Complete loss of the disc space with ankylosis at C5-C6. Severe disc space narrowing at C6-C7. Multilevel degenerative facet disease. Facet ankylosis bilaterally at C5-C6. Upper chest: No pneumothorax. Other: None IMPRESSION: CT head: No acute intracranial abnormality. Atrophy with chronic small vessel ischemic changes. CT cervical spine: Multilevel degenerative disease as described. No acute bone abnormality in cervical spine. Electronically Signed   By: Richarda OverlieAdam  Henn M.D.   On: 02/24/2016 12:22   Ct Abdomen Pelvis W Contrast  Result Date: 02/24/2016 CLINICAL DATA:  Multiple falls, soft tissue hematoma, RIGHT hip pain, abdominal pain,  dementia, hypertension, atrial fibrillation, on Xarelto EXAM: CT ABDOMEN AND PELVIS WITH CONTRAST TECHNIQUE: Multidetector CT imaging of the abdomen and pelvis was performed using the standard protocol following bolus administration of intravenous contrast. Sagittal and coronal MPR images reconstructed from axial data set. CONTRAST:  80 cc Isovue 300 IV.  Oral contrast was not administered. COMPARISON:  01/14/2016 FINDINGS: Lower chest: Bibasilar atelectasis. Peripheral rounded opacity LEFT lower lobe 20 x 14 mm image 14, 18 x 13 mm on previous exam, uncertain if represents nodule or rounded atelectasis. Hepatobiliary: Gallbladder and liver normal appearance Pancreas: Normal appearance Spleen: Normal appearance Adrenals/Urinary Tract: Adrenal glands normal appearance. Tiny nonobstructing LEFT renal calculus. Small BILATERAL renal cysts. Unremarkable ureters. Few small bladder diverticula. Mild prostatic enlargement, gland 5.3 x 3.9 cm image 88. Stomach/Bowel: Appendix not visualized. Diverticulosis of sigmoid colon without evidence of diverticulitis. Tiny hiatal hernia. Stomach and bowel loops otherwise normal appearance for exam limited by lack of oral contrast and adequate distention Vascular/Lymphatic: Atherosclerotic calcifications aorta and coronary arteries. Aorta normal caliber. No adenopathy. Reproductive: N/A Other: Infiltrative changes superior to the pubic symphysis and superior pubic rami likely representing hemorrhage within the space of Retzius. No free air or free fluid. Tiny umbilical hernia containing fat. Musculoskeletal: Nondisplaced fractures of the RIGHT superior and inferior pubic rami, likely accounting for the suprapubic hematoma. Cannot completely exclude avulsion injury at the rectus abdominis insertions. Advanced degenerative changes of both hip joints. Osseous demineralization with multilevel degenerative disc and facet disease changes of the thoracolumbar spine. Severe multifactorial  central acquired spinal stenosis at L1-L2, slightly less severe at T12-L1 and L2-L3. IMPRESSION: No acute intra-abdominal or intrapelvic abnormalities. Mild prostatic enlargement with note of multiple small bladder diverticula. Tiny nonobstructing LEFT renal calculus. Sigmoid diverticulosis. Nondisplaced fractures RIGHT superior and inferior pubic rami with associated suprapubic hematoma/infiltrative changes in the prevesical space, difficult to entirely exclude coexistent injuries at the rectus abdominis insertions. Multilevel severe central acquired spinal stenosis T12-L1, L1-L2, and L2-L3 as above. Aortic atherosclerosis and coronary arterial calcification. Electronically Signed   By: Ulyses SouthwardMark  Boles M.D.   On: 02/24/2016 12:28   Dg Chest Port 1 View  Result Date: 02/24/2016 CLINICAL DATA:  Shortness of breath and cough EXAM: PORTABLE CHEST 1 VIEW COMPARISON:  None. FINDINGS: Cardiac shadow is within normal limits. Aortic calcifications are seen. The lungs are hypoinflated but clear. No acute bony abnormality is seen. IMPRESSION: Aortic atherosclerosis. No acute abnormality noted. Electronically Signed   By: Alcide CleverMark  Lukens M.D.   On: 02/24/2016 15:24   Dg Hip Unilat With Pelvis 2-3 Views Right  Result Date: 02/24/2016 CLINICAL DATA:  Status post fall.  Right hip pain. EXAM: DG HIP (  WITH OR WITHOUT PELVIS) 2-3V RIGHT COMPARISON:  None. FINDINGS: Severe osteopenia. No acute fracture or dislocation. Severe osteoarthritis of the right hip with joint space narrowing, marginal osteophytosis and subchondral sclerosis. Moderate osteoarthritis of the left hip. Osteoarthritis of bilateral sacroiliac joints. Degenerative disc disease with disc height loss at L4-5 and L5-S1. IMPRESSION: 1. No acute osseous injury of the right hip. Given the patient's age and osteopenia, if there is persistent clinical concern for an occult hip fracture, then a MRI of the hip is recommended for increased sensitivity. 2. Severe  osteoarthritis of the right hip. Electronically Signed   By: Elige Ko   On: 02/24/2016 11:27   Dg Femur Min 2 Views Right  Result Date: 02/24/2016 CLINICAL DATA:  Status post fall.  Right hip pain. EXAM: RIGHT FEMUR 2 VIEWS COMPARISON:  None. FINDINGS: Generalized osteopenia. No fracture or dislocation. Severe osteoarthritis of the right hip joint space narrowing, marginal osteophytosis and subchondral sclerosis. No soft tissue abnormality. IMPRESSION: No acute osseous injury of the right femur. Given the patient's age and osteopenia, if there is persistent clinical concern for an occult hip fracture, a MRI of the hip is recommended for increased sensitivity. Electronically Signed   By: Elige Ko   On: 02/24/2016 11:30    Procedures Procedures (including critical care time)  Medications Ordered in ED Medications  iopamidol (ISOVUE-300) 61 % injection (not administered)  0.9 %  sodium chloride infusion (not administered)  diltiazem (CARDIZEM) injection 10 mg (not administered)  phosphorus (K PHOS NEUTRAL) tablet 500 mg (500 mg Oral Given 02/24/16 1930)  levothyroxine (SYNTHROID, LEVOTHROID) tablet 112 mcg (not administered)  memantine (NAMENDA) tablet 10 mg (10 mg Oral Given 02/24/16 2131)  metoprolol succinate (TOPROL-XL) 24 hr tablet 50 mg (50 mg Oral Given 02/24/16 1929)  0.9 %  sodium chloride infusion ( Intravenous New Bag/Given 02/24/16 1906)  acetaminophen (TYLENOL) tablet 650 mg (not administered)    Or  acetaminophen (TYLENOL) suppository 650 mg (not administered)  HYDROcodone-acetaminophen (NORCO/VICODIN) 5-325 MG per tablet 1-2 tablet (not administered)  ondansetron (ZOFRAN) tablet 4 mg (not administered)    Or  ondansetron (ZOFRAN) injection 4 mg (not administered)  ipratropium (ATROVENT) nebulizer solution 0.5 mg (0.5 mg Nebulization Given 02/24/16 2147)  sodium chloride flush (NS) 0.9 % injection 3 mL (not administered)  polyethylene glycol (MIRALAX / GLYCOLAX) packet 17 g  (not administered)  sodium chloride 0.9 % bolus 500 mL (0 mLs Intravenous Stopped 02/24/16 1541)     Initial Impression / Assessment and Plan / ED Course  I have reviewed the triage vital signs and the nursing notes.  Pertinent labs & imaging results that were available during my care of the patient were reviewed by me and considered in my medical decision making (see chart for details).  Clinical Course  Comment By Time  X-ray tib/fib, hip, and femur reviewed. No obvious fracture or dislocation.  Lona Kettle, [place] 10/02 1140  CT head/neck/abd/pelvis reviewed Lona Kettle, PA-C 10/02 1230    Patient presents to ED s/p fall with right hip and leg pain. Patient is afebrile and non-toxic appearing in NAD. Mild hypertension noted, vital signs otherwise stable. Physical exam remarkable for diffuse abdominal tenderness to palpation with mild guarding. Patient unable to lift right leg or bear weight, suspect may be secondary to pain. Check basic labs. Xray right hip pelvis, femur, and tib/fib. CT abd/pelvis given diffuse abdominal tenderness. Ct head/neck considering fall with head trauma and anti-coagulation status. Patient declines anything for  pain at this time.   Leukocytosis presents. CMP re-assuring. Lipase slightly elevated; however, nml in appearance on CT abd/pelvis. U/A negative for UTI. Checked CXR given leukocytosis to r/o pulmonary etiology - no acute cardiopulmonary process. Unclear etiology of leukocytosis.   X-ray of right lower extremity show no acute fracture or dislocation. CT head shows mild swelling of right scalp, no skull fracture or intracranial hemorrhage. CT neck shows DDD, no acute abnormalities. CT abd/pelvis remarkable for superior and inferior pubic ramus fracture with suprapubic hematoma. Suspect fall mechanical in nature. Will consult ortho regarding pubic ramus fracture. Consider observation admission given patient age, anti-coagulation status, multiple  falls, and suprapubic hematoma.   1:57 PM: Spoke with Dr. Linna Caprice. D/c blood thinners. Recommend conservative management to include weight bearing with walker and OP follow up. Thank you Dr. Linna Caprice for your time and recommendations, I greatly appreciate it.  Patient lives in an independent living facility with wife. He had multiple falls this morning requiring additional assistance. Patient could benefit from PT/OT evaluation. Spoke with Dr. Konrad Dolores of [facility], greatly appreciated his time and input. Recommended working with case management to get patient placed in rehabilitation facility. If unable, will admit for obs and PT/OT eval. Spoke with case management, greatly appreciate their time and answering my questions. At this time, placement not realistic. Patient unsafe for d/c given level of care needed. Will consult hospitalist for admission.   4:01 PM: Spoke with Dr. Isidoro Donning, agree to admit patient for further observation. Likely will need PT/OT evaluation in AM and repeat CBC to r/o expanding hematoma. Thank you Dr. Isidoro Donning for your time and continued care of this patient.   4:15PM: During Dr. Fransico Him assessment patient went into afib with RVR. EKG confirmed afib with RVR. Likely secondary to stress. Fluid bolus and cardizem dose given. Pt admission status changed to inpatient for further management of afib with RVR, pubic ramus fracture, and frequent falls.    Final Clinical Impressions(s) / ED Diagnoses   Final diagnoses:  Fall, initial encounter  Closed fracture of ramus of right pubis, initial encounter [facility])  Atrial fibrillation with RVR [facility])    New Prescriptions Current Discharge Medication List       Lona Kettle, PA-C 02/24/16 [address] [place], PA-C 02/24/16 2306    Donnetta Hutching, MD 02/25/16 1238

## 2016-02-24 NOTE — ED Triage Notes (Signed)
[facility]. Pt. Most recently slipped down out of his wheel chair and was helped back into it by staff. Pt. Denies any neck or back pain or LOC. Pt. C/o right head pain ( hematoma noted) and right hip pain. Pt. Is on xarelto. EMS reports no shortening or rotation of the right lower extremity. Pt. In C-collar and pelvic binding as precaution. Pt. Refused pain  Medication en route.

## 2016-02-24 NOTE — ED Notes (Signed)
MD aware of patient HR 120's -130" advised give 500 CC Normal saline Bolus.

## 2016-02-25 LAB — CBC
HEMATOCRIT: 38.7 % — AB (ref 39.0–52.0)
HEMOGLOBIN: 12.7 g/dL — AB (ref 13.0–17.0)
MCH: 33.5 pg (ref 26.0–34.0)
MCHC: 32.8 g/dL (ref 30.0–36.0)
MCV: 102.1 fL — AB (ref 78.0–100.0)
Platelets: 194 10*3/uL (ref 150–400)
RBC: 3.79 MIL/uL — AB (ref 4.22–5.81)
RDW: 13.8 % (ref 11.5–15.5)
WBC: 11.1 10*3/uL — ABNORMAL HIGH (ref 4.0–10.5)

## 2016-02-25 LAB — BASIC METABOLIC PANEL
Anion gap: 8 (ref 5–15)
BUN: 11 mg/dL (ref 6–20)
CHLORIDE: 104 mmol/L (ref 101–111)
CO2: 28 mmol/L (ref 22–32)
Calcium: 9.5 mg/dL (ref 8.9–10.3)
Creatinine, Ser: 0.92 mg/dL (ref 0.61–1.24)
GFR calc Af Amer: >60 mL/min (ref >60)
GFR calc non Af Amer: >60 mL/min (ref >60)
Glucose, Bld: 111 mg/dL — ABNORMAL HIGH (ref 65–99)
POTASSIUM: 3.9 mmol/L (ref 3.5–5.1)
SODIUM: 140 mmol/L (ref 135–145)

## 2016-02-25 LAB — TROPONIN I
Troponin I: <0.03 ng/mL (ref <0.03)
Troponin I: <0.03 ng/mL (ref <0.03)

## 2016-02-25 MED ORDER — IPRATROPIUM BROMIDE 0.02 % IN SOLN: 0.5000 mg | Freq: Four times a day (QID) | RESPIRATORY_TRACT | Status: DC | PRN

## 2016-02-25 NOTE — Evaluation (Signed)
Physical Therapy Evaluation Patient Details Name: Philip Morse MRN: [id number] DOB: 07-26-1925 Today's Date: 02/25/2016   History of Present Illness  Pt is an 82 y/o male with PMH of HTN, HLD, dementia and a-fib previously on anticoagulation, presents with fall with R pelvic fractures and sore R arm.   Clinical Impression  Patient presents with decreased independence with mobility due to deficits listed in PT problem list.  He will benefit from skilled PT in the acute setting to allow return home with family support following SNF level rehab stay.     Follow Up Recommendations SNF    Equipment Recommendations  None recommended by PT    Recommendations for Other Services       Precautions / Restrictions Precautions Precautions: Fall Restrictions Weight Bearing Restrictions: Yes RLE Weight Bearing: Weight bearing as tolerated      Mobility  Bed Mobility Overal bed mobility: Needs Assistance Bed Mobility: Supine to Sit;Sit to Supine     Supine to sit: Max assist Sit to supine: Min assist   General bed mobility comments: assist to move legs off bed and to lift trunk upright, assist with legs into bed  Transfers Overall transfer level: Needs assistance Equipment used: Rolling walker (2 wheeled) Transfers: Sit to/from Stand Sit to Stand: Mod assist         General transfer comment: lifting help from EOB  Ambulation/Gait Ambulation/Gait assistance: Min assist;Mod assist Ambulation Distance (Feet): 2 Feet Assistive device: Rolling walker (2 wheeled) Gait Pattern/deviations: Step-to pattern;Antalgic     General Gait Details: unable to walk far due to pain with pressure on R LE and pt felt unsteady.  Took two steps forward and two steps backwards  Information systems managertairs            Wheelchair Mobility    Modified Rankin (Stroke Patients Only)       Balance Overall balance assessment: Needs assistance Sitting-balance support: Bilateral upper extremity supported;Feet  supported Sitting balance-Leahy Scale: Poor Sitting balance - Comments: UE support for balance at EOB   Standing balance support: Bilateral upper extremity supported Standing balance-Leahy Scale: Poor Standing balance comment: UE support and assist for balance                              Pertinent Vitals/Pain Pain Assessment: Faces Faces Pain Scale: Hurts even more Pain Location: R leg with weight bearing Pain Descriptors / Indicators: Grimacing;Guarding;Moaning Pain Intervention(s): Limited activity within patient's tolerance;Monitored during session;Repositioned    Home Living Family/patient expects to be discharged to:: Skilled nursing facility                 Additional Comments[place]    Prior Function           Comments: poor historian, but reports walked with walker     Hand Dominance        Extremity/Trunk Assessment   Upper Extremity Assessment: RUE deficits/detail RUE Deficits / Details: elbow strength and ROM WFL, shoulder elevation limited due to pain         Lower Extremity Assessment: RLE deficits/detail;LLE deficits/detail RLE Deficits / Details: AAROM limited to about 45 hip flexion with pain; knee extension 3/5 LLE Deficits / Details: AROM WFL  Cervical / Trunk Assessment: Kyphotic  Communication   Communication: Expressive difficulties (mumbles at times)  Cognition Arousal/Alertness: Awake/alert Behavior During Therapy: WFL for tasks assessed/performed Overall Cognitive Status: No family/caregiver  present to determine baseline cognitive functioning                      General Comments      Exercises General Exercises - Lower Extremity Ankle Circles/Pumps: AROM;Both;10 reps;Supine Heel Slides: AAROM;AROM;Both;5 reps;Supine   Assessment/Plan    PT Assessment Patient needs continued PT services  PT Problem List Decreased strength;Decreased activity  tolerance;Decreased balance;Decreased mobility;Decreased safety awareness;Pain;Decreased knowledge of use of DME          PT Treatment Interventions DME instruction;Gait training;Functional mobility training;Balance training;Therapeutic exercise;Therapeutic activities;Patient/family education    PT Goals (Current goals can be found in the Care Plan section)  Acute Rehab PT Goals PT Goal Formulation: Patient unable to participate in goal setting Time For Goal Achievement: 03/10/16 Potential to Achieve Goals: Good    Frequency Min 3X/week   Barriers to discharge        Co-evaluation               End of Session Equipment Utilized During Treatment: Gait belt Activity Tolerance: Patient limited by pain Patient left: in bed;with call bell/phone within reach;with bed alarm set           Time: 1134-1209 PT Time Calculation (min) (ACUTE ONLY): 35 min   Charges:   PT Evaluation $PT Eval Moderate Complexity: 1 Procedure PT Treatments $Therapeutic Activity: 8-22 mins   PT G CodesElray Mcgregor February 29, 2016, 1:54 PM  Sheran Lawless, PT [phone] 02/29/2016

## 2016-02-25 NOTE — Progress Notes (Signed)
[facility]                                                                              Patient Demographics  Philip Morse, is a 82 y.o. male, DOB - December 24, 1925, MVH:[id number]  Admit date - 02/24/2016   Admitting Physician Peyton Spengler Jenna LuoK Erasmus Bistline, MD  Outpatient Primary MD for the patient is Eartha InchBADGER,MICHAEL C, MD  Outpatient specialists:   LOS - 1  days    Chief Complaint  Patient presents with  . Fall    on thinners       Brief summary   Patient is a 82 year old male with history of atrial fibrillation on a xarelto, hypertension, hyperlipidemia, dementia presented to ED with multiple falls from home. History was obtained from his wife, patient is a poor historian due to dementia. Per his wife, he fell, described as "rolled from his bed" around 2 AM in the morning. She had to call for assistance to get patient back into the bed. Subsequently this morning when he woke up and got out of the bed. He was walking with assistance of his walker when his right knee gave out and patient fell on his right side and tripped over his walker. Per wife, he had no dizziness, lightheadedness or syncopal episode. No chest pain or shortness of breath. She states that he did hit his head. EMS was called and patient was brought to ED, placed in C-spine. Patient complaint of right leg pain radiating to right hip, unable to lift his right leg, difficult to put weight on his right foot. At the time of my examination during the encounter, patient in Afib RVR, HR 130's ED work-up/course:  In ED, afebrile, heart rate initially was in 50s however now has been persisting atrial fibrillation with RVR 125-138. BP 147/98, respiratory rate 18 and O2 sats 96% UA negative for UTI CT abdomen showed no acute intra-abdominal or intrapelvic abnormalities. Nondisplaced fracture right superior inferior pubic rami with associated suprapubic hematoma/infiltrative changes in the pre-vesicle space. Severe spinal stenosis  at T12-L1 and L1-L2 and L2-L3 CT head, C-spine normal    Assessment & Plan   Atrial fibrillation with RVR (HCC): RVR precipitated likely due to pain, anxiety - Cardiac enzymes negative, TSH 0.7, heart rate improving, 90-low 100's - Received Cardizem 10 mg 1 in ED, placed back on beta blocker - Hold anticoagulation given pubic rami fracture and hematoma, not a good candidate for anticoagulation given multiple falls - DC IV fluids   Active Problems:   Fracture of pubic rami, right - Orthopedics consulted, recommended nonoperative management -PT evaluation, maybe her weight on the right lower extremity, pain management     Falls - Patient had 2 falls on the day of admission and having multiple falls recently, not a good candidate for anticoagulation at this time - PTOT evaluation pending    HTN (hypertension) - BP better controlled, continue beta blocker   Hypothyroidism - TSH 0.7, continue Synthroid    Dementia - Continue Namenda  Code Status: Full CODE STATUS DVT Prophylaxis:  SCD's Family Communication: Discussed in detail with the patient, all imaging results, lab results explained to  the patient   Disposition Plan: PT eval   Time Spent in minutes   25 minutes  Procedures:  CT abdomen and pelvis CT head CT C-spine  Consultants:   Orthopedics  Antimicrobials:      Medications  Scheduled Meds: . diltiazem  10 mg Intravenous Once  . levothyroxine  112 mcg Oral QAC breakfast  . memantine  10 mg Oral BID  . metoprolol succinate  50 mg Oral Q supper  . phosphorus  500 mg Oral Daily  . sodium chloride flush  3 mL Intravenous Q12H   Continuous Infusions: . sodium chloride    . sodium chloride 75 mL/hr at 02/24/16 1906   PRN Meds:.acetaminophen **OR** acetaminophen, HYDROcodone-acetaminophen, ipratropium, ondansetron **OR** ondansetron (ZOFRAN) IV, polyethylene glycol   Antibiotics   Anti-infectives    None        Subjective:   Kaye Mitro was seen and examined today.  No complaints except pain in the right groin.  Patient denies dizziness, chest pain, shortness of breath, abdominal pain, N/V/D/C, new weakness, numbess, tingling. No acute events overnight.    Objective:   Vitals:   02/25/16 0526 02/25/16 0806 02/25/16 0809 02/25/16 0814  BP: 117/82     Pulse: 98     Resp: 20     Temp: 98.4 F (36.9 C) 98.3 F (36.8 C)    TempSrc: Oral Oral    SpO2: 96% 97% 93% 93%  Weight: 102.8 kg (226 lb 9.6 oz)     Height:        Intake/Output Summary (Last 24 hours) at 02/25/16 1047 Last data filed at 02/25/16 0945  Gross per 24 hour  Intake           751.25 ml  Output              215 ml  Net           536.25 ml     Wt Readings from Last 3 Encounters:  02/25/16 102.8 kg (226 lb 9.6 oz)  01/14/16 106.4 kg (234 lb 9.1 oz)     Exam  General: Alert and oriented x 3, NAD  HEENT:    Neck: Supple, no JVD,  Cardiovascular: S1 S2 Clear, irregularly irregular  Respiratory: Clear to auscultation bilaterally, no wheezing, rales or rhonchi  Gastrointestinal: Soft, nontender, nondistended, + bowel sounds  Ext: no cyanosis clubbing or edema  Neuro:No focal neurological deficits  Skin: No rashes  Psych: Normal affect and demeanor, alert and oriented x3    Data Reviewed:  I have personally reviewed following labs and imaging studies  Micro Results No results found for this or any previous visit (from the past 240 hour(s)).  Radiology Reports Dg Tibia/fibula Right  Result Date: 02/24/2016 CLINICAL DATA:  Status post fall.  Right leg pain. EXAM: RIGHT TIBIA AND FIBULA - 2 VIEW COMPARISON:  None. FINDINGS: Generalized osteopenia. No acute fracture or dislocation. No periosteal reaction or bone destruction. Peripheral vascular atherosclerotic disease. No soft tissue abnormality. IMPRESSION: No acute osseous injury of the right tibia or fibula. Electronically Signed   By: Elige Ko   On: 02/24/2016 11:28   Ct  Head Wo Contrast  Result Date: 02/24/2016 CLINICAL DATA:  Numerous recent falls at nursing home. Complains of right-sided headache. Patient is on anticoagulation. EXAM: CT HEAD WITHOUT CONTRAST CT CERVICAL SPINE WITHOUT CONTRAST TECHNIQUE: Multidetector CT imaging of the head and cervical spine was performed following the standard protocol without intravenous contrast. Multiplanar CT image reconstructions  of the cervical spine were also generated. COMPARISON:  None. FINDINGS: CT HEAD FINDINGS Brain: Mild cerebral atrophy. Low-density in the periventricular matter suggesting chronic changes. No evidence for acute hemorrhage, mass lesion, midline shift, hydrocephalus or large infarct. Vascular: No hyperdense vessel or unexpected calcification. Skull: No calvarial fracture Sinuses/Orbits: Deformity of the nasal septum with the apex towards the left. This may be related to old injury. Mild mucosal disease in the maxillary sinuses. Mastoids are clear. Other: Mild swelling on the right side of the scalp. CT CERVICAL SPINE FINDINGS Alignment: Alignment is within normal limits. Skull base and vertebrae: Negative for an acute fracture or dislocation. Diffuse osteopenia. Soft tissues and spinal canal: No prevertebral fluid or swelling. No visible canal hematoma. Disc levels: Disc space narrowing and C2-C3. Intervertebral body fusion at C3-C4. Complete loss of the disc space with ankylosis at C5-C6. Severe disc space narrowing at C6-C7. Multilevel degenerative facet disease. Facet ankylosis bilaterally at C5-C6. Upper chest: No pneumothorax. Other: None IMPRESSION: CT head: No acute intracranial abnormality. Atrophy with chronic small vessel ischemic changes. CT cervical spine: Multilevel degenerative disease as described. No acute bone abnormality in cervical spine. Electronically Signed   By: Richarda Overlie M.D.   On: 02/24/2016 12:22   Ct Cervical Spine Wo Contrast  Result Date: 02/24/2016 CLINICAL DATA:  Numerous recent  falls at nursing home. Complains of right-sided headache. Patient is on anticoagulation. EXAM: CT HEAD WITHOUT CONTRAST CT CERVICAL SPINE WITHOUT CONTRAST TECHNIQUE: Multidetector CT imaging of the head and cervical spine was performed following the standard protocol without intravenous contrast. Multiplanar CT image reconstructions of the cervical spine were also generated. COMPARISON:  None. FINDINGS: CT HEAD FINDINGS Brain: Mild cerebral atrophy. Low-density in the periventricular matter suggesting chronic changes. No evidence for acute hemorrhage, mass lesion, midline shift, hydrocephalus or large infarct. Vascular: No hyperdense vessel or unexpected calcification. Skull: No calvarial fracture Sinuses/Orbits: Deformity of the nasal septum with the apex towards the left. This may be related to old injury. Mild mucosal disease in the maxillary sinuses. Mastoids are clear. Other: Mild swelling on the right side of the scalp. CT CERVICAL SPINE FINDINGS Alignment: Alignment is within normal limits. Skull base and vertebrae: Negative for an acute fracture or dislocation. Diffuse osteopenia. Soft tissues and spinal canal: No prevertebral fluid or swelling. No visible canal hematoma. Disc levels: Disc space narrowing and C2-C3. Intervertebral body fusion at C3-C4. Complete loss of the disc space with ankylosis at C5-C6. Severe disc space narrowing at C6-C7. Multilevel degenerative facet disease. Facet ankylosis bilaterally at C5-C6. Upper chest: No pneumothorax. Other: None IMPRESSION: CT head: No acute intracranial abnormality. Atrophy with chronic small vessel ischemic changes. CT cervical spine: Multilevel degenerative disease as described. No acute bone abnormality in cervical spine. Electronically Signed   By: Richarda Overlie M.D.   On: 02/24/2016 12:22   Ct Abdomen Pelvis W Contrast  Result Date: 02/24/2016 CLINICAL DATA:  Multiple falls, soft tissue hematoma, RIGHT hip pain, abdominal pain, dementia, hypertension,  atrial fibrillation, on Xarelto EXAM: CT ABDOMEN AND PELVIS WITH CONTRAST TECHNIQUE: Multidetector CT imaging of the abdomen and pelvis was performed using the standard protocol following bolus administration of intravenous contrast. Sagittal and coronal MPR images reconstructed from axial data set. CONTRAST:  80 cc Isovue 300 IV.  Oral contrast was not administered. COMPARISON:  01/14/2016 FINDINGS: Lower chest: Bibasilar atelectasis. Peripheral rounded opacity LEFT lower lobe 20 x 14 mm image 14, 18 x 13 mm on previous exam, uncertain if represents nodule or  rounded atelectasis. Hepatobiliary: Gallbladder and liver normal appearance Pancreas: Normal appearance Spleen: Normal appearance Adrenals/Urinary Tract: Adrenal glands normal appearance. Tiny nonobstructing LEFT renal calculus. Small BILATERAL renal cysts. Unremarkable ureters. Few small bladder diverticula. Mild prostatic enlargement, gland 5.3 x 3.9 cm image 88. Stomach/Bowel: Appendix not visualized. Diverticulosis of sigmoid colon without evidence of diverticulitis. Tiny hiatal hernia. Stomach and bowel loops otherwise normal appearance for exam limited by lack of oral contrast and adequate distention Vascular/Lymphatic: Atherosclerotic calcifications aorta and coronary arteries. Aorta normal caliber. No adenopathy. Reproductive: N/A Other: Infiltrative changes superior to the pubic symphysis and superior pubic rami likely representing hemorrhage within the space of Retzius. No free air or free fluid. Tiny umbilical hernia containing fat. Musculoskeletal: Nondisplaced fractures of the RIGHT superior and inferior pubic rami, likely accounting for the suprapubic hematoma. Cannot completely exclude avulsion injury at the rectus abdominis insertions. Advanced degenerative changes of both hip joints. Osseous demineralization with multilevel degenerative disc and facet disease changes of the thoracolumbar spine. Severe multifactorial central acquired spinal  stenosis at L1-L2, slightly less severe at T12-L1 and L2-L3. IMPRESSION: No acute intra-abdominal or intrapelvic abnormalities. Mild prostatic enlargement with note of multiple small bladder diverticula. Tiny nonobstructing LEFT renal calculus. Sigmoid diverticulosis. Nondisplaced fractures RIGHT superior and inferior pubic rami with associated suprapubic hematoma/infiltrative changes in the prevesical space, difficult to entirely exclude coexistent injuries at the rectus abdominis insertions. Multilevel severe central acquired spinal stenosis T12-L1, L1-L2, and L2-L3 as above. Aortic atherosclerosis and coronary arterial calcification. Electronically Signed   By: Ulyses Southward M.D.   On: 02/24/2016 12:28   Dg Chest Port 1 View  Result Date: 02/24/2016 CLINICAL DATA:  Shortness of breath and cough EXAM: PORTABLE CHEST 1 VIEW COMPARISON:  None. FINDINGS: Cardiac shadow is within normal limits. Aortic calcifications are seen. The lungs are hypoinflated but clear. No acute bony abnormality is seen. IMPRESSION: Aortic atherosclerosis. No acute abnormality noted. Electronically Signed   By: Alcide Clever M.D.   On: 02/24/2016 15:24   Dg Hip Unilat With Pelvis 2-3 Views Right  Result Date: 02/24/2016 CLINICAL DATA:  Status post fall.  Right hip pain. EXAM: DG HIP (WITH OR WITHOUT PELVIS) 2-3V RIGHT COMPARISON:  None. FINDINGS: Severe osteopenia. No acute fracture or dislocation. Severe osteoarthritis of the right hip with joint space narrowing, marginal osteophytosis and subchondral sclerosis. Moderate osteoarthritis of the left hip. Osteoarthritis of bilateral sacroiliac joints. Degenerative disc disease with disc height loss at L4-5 and L5-S1. IMPRESSION: 1. No acute osseous injury of the right hip. Given the patient's age and osteopenia, if there is persistent clinical concern for an occult hip fracture, then a MRI of the hip is recommended for increased sensitivity. 2. Severe osteoarthritis of the right hip.  Electronically Signed   By: Elige Ko   On: 02/24/2016 11:27   Dg Femur Min 2 Views Right  Result Date: 02/24/2016 CLINICAL DATA:  Status post fall.  Right hip pain. EXAM: RIGHT FEMUR 2 VIEWS COMPARISON:  None. FINDINGS: Generalized osteopenia. No fracture or dislocation. Severe osteoarthritis of the right hip joint space narrowing, marginal osteophytosis and subchondral sclerosis. No soft tissue abnormality. IMPRESSION: No acute osseous injury of the right femur. Given the patient's age and osteopenia, if there is persistent clinical concern for an occult hip fracture, a MRI of the hip is recommended for increased sensitivity. Electronically Signed   By: Elige Ko   On: 02/24/2016 11:30    Lab Data:  CBC:  Recent Labs Lab 02/24/16 1041 02/25/16  0626  WBC 13.4* 11.1*  NEUTROABS 11.0*  --   HGB 14.0 12.7*  HCT 41.5 38.7*  MCV 99.8 102.1*  PLT 211 194   Basic Metabolic Panel:  Recent Labs Lab 02/24/16 1041 02/25/16 0626  NA 141 140  K 3.9 3.9  CL 103 104  CO2 27 28  GLUCOSE 111* 111*  BUN 16 11  CREATININE 1.04 0.92  CALCIUM 10.1 9.5   GFR: Estimated Creatinine Clearance: 71 mL/min (by C-G formula based on SCr of 0.92 mg/dL). Liver Function Tests:  Recent Labs Lab 02/24/16 1041  AST 28  ALT 30  ALKPHOS 89  BILITOT 1.1  PROT 7.9  ALBUMIN 3.7    Recent Labs Lab 02/24/16 1041  LIPASE 65*   No results for input(s): AMMONIA in the last 168 hours. Coagulation Profile: No results for input(s): INR, PROTIME in the last 168 hours. Cardiac Enzymes:  Recent Labs Lab 02/24/16 1857 02/25/16 0010 02/25/16 0626  TROPONINI <0.03 <0.03 <0.03   BNP (last 3 results) No results for input(s): PROBNP in the last 8760 hours. HbA1C: No results for input(s): HGBA1C in the last 72 hours. CBG: No results for input(s): GLUCAP in the last 168 hours. Lipid Profile: No results for input(s): CHOL, HDL, LDLCALC, TRIG, CHOLHDL, LDLDIRECT in the last 72 hours. Thyroid  Function Tests:  Recent Labs  02/24/16 1857  TSH 0.734   Anemia Panel: No results for input(s): VITAMINB12, FOLATE, FERRITIN, TIBC, IRON, RETICCTPCT in the last 72 hours. Urine analysis:    Component Value Date/Time   COLORURINE YELLOW 02/24/2016 1328   APPEARANCEUR CLEAR 02/24/2016 1328   LABSPEC <1.005 (L) 02/24/2016 1328   PHURINE 6.5 02/24/2016 1328   GLUCOSEU NEGATIVE 02/24/2016 1328   HGBUR TRACE (A) 02/24/2016 1328   BILIRUBINUR NEGATIVE 02/24/2016 1328   KETONESUR NEGATIVE 02/24/2016 1328   PROTEINUR NEGATIVE 02/24/2016 1328   NITRITE NEGATIVE 02/24/2016 1328   LEUKOCYTESUR NEGATIVE 02/24/2016 1328     Aprille Sawhney M.D. [facility] 02/25/2016, 10:47 AM  Pager: [phone] Between 7am to 7pm - call Pager - [phone]  After 7pm go to [email or web address] - password [email or web address]  Call night coverage person covering after 7pm

## 2016-02-25 NOTE — Progress Notes (Addendum)
At approximately 21:08, Pollie MeyerBrittany Buckner, RN was alerted by central telemetry by phone that patient had "all leads off." At that point, [place] reviewed video monitor and discovered patient was on the floor. Staff responded to bedside immediately and found patient sitting against the left side of the bed on the floor. He had apparently exited the bed unassisted from the bottom left side; other rails were all up. Bed alarm was not set and did not alert staff to patient attempting to get OOB unassisted. He had been incontinent of stool and urine (unchanged). He was in his normal mental state (confused, disoriented to place, time and situation); baseline dementia and unable to give history of event. He denied hitting his head and did not appear to have any new injuries; baseline right hip fractures identified on admission per CT from previous falls at ALF. Vital signs obtained: Blood pressure 126/90, pulse (!) 139, atrial fibrillation with RVR (unchanged with exception to rate) resp. rate 20, SpO2 91 %. Incontinent care was provided in situ.  Patient was not complaining of pain at rest; moderate pain with quick, spontaneous resolution was elicited with attempts to position him for return to bed (this is also baseline) using lift equipment after vital signs were obtained.  Schorr, NP on call was notified of patient fall and findings by text page. No new orders received at this time.  Safety interventions in place.  Family was updated by telephone.

## 2016-02-25 NOTE — [place] FL2 (Signed)
[place] MEDICAID FL2 LEVEL OF CARE SCREENING TOOL     IDENTIFICATION  Patient Name: Philip Morse Birthdate: [birth year] Sex: male Admission Date (Current Location): 02/24/2016  [facility] [place]   Facility and Address:  [facility] [place], [address], [place], [place] [address]      Provider Number: [id number]  Attending Physician Name and Address:  Cathren Harshipudeep K Rai, MD  Relative Name and Phone Number:       Current Level of Care: Hospital Recommended Level of Care: Skilled Nursing Facility Prior Approval Number:    Date Approved/Denied:   PASRR Number: [id number]  Discharge Plan: SNF    Current Diagnoses: Patient Active Problem List   Diagnosis Date Noted  . Fracture of pubic rami, right 02/24/2016  . Falls 02/24/2016  . Pubic ramus fracture (HCC) 02/24/2016  . HTN (hypertension)   . HLD (hyperlipidemia)   . Dementia   . Atrial fibrillation with RVR (HCC)   . Transaminitis 01/14/2016  . Leukocytosis 01/14/2016  . Nausea vomiting and diarrhea 01/14/2016    Orientation RESPIRATION BLADDER Height & Weight     Self, Time, Situation, Place  Normal Incontinent Weight: 226 lb 9.6 oz (102.8 kg) Height:  6\' 2"  (188 cm)  BEHAVIORAL SYMPTOMS/MOOD NEUROLOGICAL BOWEL NUTRITION STATUS      Continent Diet (Heart healthy: thin liquids)  AMBULATORY STATUS COMMUNICATION OF NEEDS Skin   Limited Assist Verbally PU Stage and Appropriate Care PU Stage 1 Dressing:  (PRN)                     Personal Care Assistance Level of Assistance  Bathing, Feeding, Dressing Bathing Assistance: Limited assistance Feeding assistance: Limited assistance Dressing Assistance: Limited assistance     Functional Limitations Info  Sight, Hearing, Speech Sight Info: Adequate Hearing Info: Adequate Speech Info: Adequate    SPECIAL CARE FACTORS FREQUENCY  PT (By licensed PT), OT (By licensed OT)     PT Frequency: 3x week OT Frequency: 3x  week            Contractures Contractures Info: Not present    Additional Factors Info  Code Status, Allergies Code Status Info: Full Allergies Info: No known alergies           Current Medications (02/25/2016):  This is the current hospital active medication list Current Facility-Administered Medications  Medication Dose Route Frequency Provider Last Rate Last Dose  . acetaminophen (TYLENOL) tablet 650 mg  650 mg Oral Q6H PRN Ripudeep Jenna LuoK Rai, MD       Or  . acetaminophen (TYLENOL) suppository 650 mg  650 mg Rectal Q6H PRN Ripudeep Jenna LuoK Rai, MD      . diltiazem (CARDIZEM) injection 10 mg  10 mg Intravenous Once Ripudeep K Rai, MD      . HYDROcodone-acetaminophen (NORCO/VICODIN) 5-325 MG per tablet 1-2 tablet  1-2 tablet Oral Q4H PRN Ripudeep K Rai, MD      . ipratropium (ATROVENT) nebulizer solution 0.5 mg  0.5 mg Nebulization Q6H PRN Ripudeep K Rai, MD      . levothyroxine (SYNTHROID, LEVOTHROID) tablet 112 mcg  112 mcg Oral QAC breakfast Ripudeep K Rai, MD      . memantine [facility](NAMENDA) tablet 10 mg  10 mg Oral BID Ripudeep Jenna LuoK Rai, MD   10 mg at 02/25/16 [id number]  . metoprolol succinate (TOPROL-XL) 24 hr tablet 50 mg  50 mg Oral Q supper Ripudeep Jenna LuoK Rai, MD   50 mg at  02/24/16 1929  . ondansetron (ZOFRAN) tablet 4 mg  4 mg Oral Q6H PRN Ripudeep K Rai, MD       Or  . ondansetron (ZOFRAN) injection 4 mg  4 mg Intravenous Q6H PRN Ripudeep K Rai, MD      . phosphorus (K PHOS NEUTRAL) tablet 500 mg  500 mg Oral Daily Ripudeep Jenna Luo, MD   500 mg at 02/25/16 1610  . polyethylene glycol (MIRALAX / GLYCOLAX) packet 17 g  17 g Oral Daily PRN Ripudeep K Rai, MD      . sodium chloride flush (NS) 0.9 % injection 3 mL  3 mL Intravenous Q12H Ripudeep K Rai, MD   3 mL at 02/24/16 2200     Discharge Medications: Please see discharge summary for a list of discharge medications.  Relevant Imaging Results:  Relevant Lab Results:   Additional Information SSN: [id number]  Volney American,  LCSW

## 2016-02-26 DIAGNOSIS — R296 Repeated falls: Secondary | ICD-10-CM

## 2016-02-26 DIAGNOSIS — L8990 Pressure ulcer of unspecified site, unspecified stage: Secondary | ICD-10-CM | POA: Insufficient documentation

## 2016-02-26 DIAGNOSIS — E039 Hypothyroidism, unspecified: Secondary | ICD-10-CM

## 2016-02-26 DIAGNOSIS — S32501D Unspecified fracture of right pubis, subsequent encounter for fracture with routine healing: Secondary | ICD-10-CM

## 2016-02-26 LAB — URINE CULTURE

## 2016-02-26 NOTE — Clinical Social Work Note (Signed)
Clinical Social Work Assessment  Patient Details  Name: Philip Morse MRN: [id number] Date of Birth: 1925-05-31  Date of referral:  02/26/16               Reason for consult:  Facility Placement, Discharge Planning                Permission sought to share information with:  Facility Medical sales representativeContact Representative, Family Supports Permission granted to share information::  Yes, Verbal Permission Granted  Name::     Amanda PeaLori Pope  Ag[facility]  Relationship::  Daughter  Contact Information:     Housing/Transportation Living arrangements for the past 2 months:  [facility] Source of Information:  Adult Children Patient Interpreter Needed:  None Criminal Activity/Legal Involvement Pertinent to Current Situation/Hospitalization:  No - Comment as needed Significant Relationships:  Adult Children Lives with:  Facility Resident Do you feel safe going back to the place where you live?  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   Social Worker assessment / plan:  CSW spoke with patient's daughter Amanda PeaLori Pope by phone to complete assessment as no family currently at bedside. Lawson FiscalLori confirms that the [place] [facility] [place] [facility]. She states that she has been in contact with the SNF and plans [facility] the [facility]. CSW explained SNF search/placement process and answered the daughter's questions. [facility] confirms that they can offer the patient a bed. CSW will assist with DC when appropriate.  Employment status:  Retired Health and safety inspectornsurance information:  [employer] PT Recommendations:  Skilled Nursing Facility Information / Referral to community resources:  Skilled Nursing Facility  Patient/Family's Response to care:  The patient's daughter appears  happy with the care the patient is receiving.  Patient/Family's Understanding of and Emotional Response to Diagnosis, Current Treatment, and Prognosis:  The patient's daughter appears to have a good understanding of the patient's diagnosis, reason for hospitalization and the patient's post DC needs.   Emotional Assessment Appearance:  Appears stated age Attitude/Demeanor/Rapport:  Unable to Assess Affect (typically observed):  Unable to Assess Orientation:  Oriented to Self Alcohol / Substance use:  Not Applicable Psych involvement (Current and /or in the community):  No (Comment)  Discharge Needs  Concerns to be addressed:  Care Coordination, Discharge Planning Concerns Readmission within the last 30 days:  No Current discharge risk:  Physical Impairment, Cognitively Impaired Barriers to Discharge:  Continued Medical Work up   Andi Henceampbell, Saajan Willmon B, LCSW 02/26/2016, 1:17 PM

## 2016-02-26 NOTE — [place] FL2 (Signed)
[place] MEDICAID FL2 LEVEL OF CARE SCREENING TOOL     IDENTIFICATION  Patient Name: Philip Morse Birthdate: 08-22-1925 Sex: male Admission Date (Current Location): 02/24/2016  [place] and [place] Number:  [place] and Address:  The [place], [address], [place], [place] [address]      Provider Number: [id number]  Attending Physician Name and Address:   Art, DO  Relative Name and Phone Number:       Current Level of Care: Hospital Recommended Level of Care: Skilled Nursing Facility Prior Approval Number:    Date Approved/Denied:   PASRR Number: [id number]  Discharge Plan: SNF    Current Diagnoses: Patient Active Problem List   Diagnosis Date Noted  . Pressure injury of skin 02/26/2016  . Fracture of pubic rami, right 02/24/2016  . Falls 02/24/2016  . Pubic ramus fracture (HCC) 02/24/2016  . HTN (hypertension)   . HLD (hyperlipidemia)   . Dementia   . Atrial fibrillation with RVR (HCC)   . Transaminitis 01/14/2016  . Leukocytosis 01/14/2016  . Nausea vomiting and diarrhea 01/14/2016    Orientation RESPIRATION BLADDER Height & Weight     Self, Time, Situation, Place  Normal Incontinent Weight: 98.3 kg (216 lb 12.8 oz) Height:  6\' 2"  (188 cm)  BEHAVIORAL SYMPTOMS/MOOD NEUROLOGICAL BOWEL NUTRITION STATUS      Continent Diet (Heart healthy: thin liquids)  AMBULATORY STATUS COMMUNICATION OF NEEDS Skin   Limited Assist Verbally PU Stage and Appropriate Care PU Stage 1 Dressing:  (PRN)                     Personal Care Assistance Level of Assistance  Bathing, Feeding, Dressing Bathing Assistance: Limited assistance Feeding assistance: Limited assistance Dressing Assistance: Limited assistance     Functional Limitations Info  Sight, Hearing, Speech Sight Info: Adequate Hearing Info: Adequate Speech Info: Adequate    SPECIAL CARE FACTORS FREQUENCY  PT (By licensed PT), OT (By licensed OT)     PT  Frequency: 3x week OT Frequency: 3x week            Contractures Contractures Info: Not present    Additional Factors Info  Code Status, Allergies Code Status Info: Full Allergies Info: No known alergies           Current Medications (02/26/2016):  This is the current hospital active medication list Current Facility-Administered Medications  Medication Dose Route Frequency Provider Last Rate Last Dose  . acetaminophen (TYLENOL) tablet 650 mg  650 mg Oral Q6H PRN Ripudeep Jenna Luo, MD       Or  . acetaminophen (TYLENOL) suppository 650 mg  650 mg Rectal Q6H PRN Ripudeep K Rai, MD      . diltiazem (CARDIZEM) injection 10 mg  10 mg Intravenous Once Ripudeep K Rai, MD      . HYDROcodone-acetaminophen (NORCO/VICODIN) 5-325 MG per tablet 1-2 tablet  1-2 tablet Oral Q4H PRN Ripudeep Jenna Luo, MD   1 tablet at 02/26/16 0945  . ipratropium (ATROVENT) nebulizer solution 0.5 mg  0.5 mg Nebulization Q6H PRN Ripudeep K Rai, MD      . levothyroxine (SYNTHROID, LEVOTHROID) tablet 112 mcg  112 mcg Oral QAC breakfast Ripudeep Jenna Luo, MD   112 mcg at 02/26/16 0940  . memantine (NAMENDA) tablet 10 mg  10 mg Oral BID Ripudeep Jenna Luo, MD   10 mg at 02/26/16 0940  . metoprolol succinate (TOPROL-XL) 24 hr tablet 50 mg  50  mg Oral Q supper Ripudeep Jenna LuoK Rai, MD   50 mg at 02/25/16 1717  . ondansetron (ZOFRAN) tablet 4 mg  4 mg Oral Q6H PRN Ripudeep K Rai, MD       Or  . ondansetron (ZOFRAN) injection 4 mg  4 mg Intravenous Q6H PRN Ripudeep K Rai, MD      . phosphorus (K PHOS NEUTRAL) tablet 500 mg  500 mg Oral Daily Ripudeep Jenna LuoK Rai, MD   500 mg at 02/26/16 0941  . polyethylene glycol (MIRALAX / GLYCOLAX) packet 17 g  17 g Oral Daily PRN Ripudeep K Rai, MD      . sodium chloride flush (NS) 0.9 % injection 3 mL  3 mL Intravenous Q12H Ripudeep Jenna LuoK Rai, MD   3 mL at 02/26/16 0941     Discharge Medications: Please see discharge summary for a list of discharge medications.  Relevant Imaging Results:  Relevant Lab  Results:   Additional Information SSN: [id number]  Venita LickCampbell, Jeselle Hiser B, LCSW

## 2016-02-26 NOTE — Progress Notes (Signed)
[facility]                                                                              Patient Demographics  Philip Morse, is a 82 y.o. male, DOB - [birth year], RUE:[id number]  Admit date - 02/24/2016   Admitting Physician Ripudeep Jenna Luo, MD  Outpatient Primary MD for the patient is Eartha Inch, MD  Outpatient specialists:   LOS - 2  days    Chief Complaint  Patient presents with  . Fall    on thinners       Brief summary   Patient is a 82 year old male with history of atrial fibrillation on a xarelto, hypertension, hyperlipidemia, dementia presented to ED with multiple falls from home. History was obtained from his wife, patient is a poor historian due to dementia. Per his wife, he fell, described as "rolled from his bed" around 2 AM in the morning. She had to call for assistance to get patient back into the bed. Subsequently this morning when he woke up and got out of the bed. He was walking with assistance of his walker when his right knee gave out and patient fell on his right side and tripped over his walker. Per wife, he had no dizziness, lightheadedness or syncopal episode. No chest pain or shortness of breath. She states that he did hit his head. EMS was called and patient was brought to ED, placed in C-spine. Patient complaint of right leg pain radiating to right hip, unable to lift his right leg, difficult to put weight on his right foot. At the time of my examination during the encounter, patient in Afib RVR, HR 130's   Assessment & Plan   Atrial fibrillation with RVR (HCC): RVR precipitated likely due to pain, anxiety - Cardiac enzymes negative, TSH 0.7, heart rate improving, 90-low 100's - Received Cardizem 10 mg 1 in ED, placed back on beta blocker - Hold anticoagulation given pubic rami fracture and hematoma, not a good candidate for anticoagulation given multiple falls    Fracture of pubic rami, right - Orthopedics consulted, recommended  nonoperative management -PT evaluation, maybe bear weight on the right lower extremity, pain management     Falls - Patient had 2 falls on the day of admission and having multiple falls recently, not a good candidate for anticoagulation at this time - SNF pending    HTN (hypertension) - BP better controlled, continue beta blocker  Hypothyroidism - TSH 0.7, continue Synthroid    Dementia - Continue Namenda  Code Status: Full CODE STATUS DVT Prophylaxis:  SCD's Family Communication: Called daughter and wife Disposition Plan: SNF  Time Spent in minutes   25 minutes   Consultants:   Orthopedics    Medications  Scheduled Meds: . diltiazem  10 mg Intravenous Once  . levothyroxine  112 mcg Oral QAC breakfast  . memantine  10 mg Oral BID  . metoprolol succinate  50 mg Oral Q supper  . phosphorus  500 mg Oral Daily  . sodium chloride flush  3 mL Intravenous Q12H   Continuous Infusions:   PRN Meds:.acetaminophen **OR** acetaminophen, HYDROcodone-acetaminophen, ipratropium, ondansetron **OR** ondansetron (  ZOFRAN) IV, polyethylene glycol   Antibiotics   Anti-infectives    None        Subjective:   Philip Morse had a fall overnight-- witnessed on the camera-- patient is very impulsive  Objective:   Vitals:   02/25/16 2109 02/26/16 0015 02/26/16 0519 02/26/16 1357  BP: 126/90 130/78 (!) 135/91   Pulse: (!) 139 97 81   Resp:  (!) 24 (!) 22   Temp:  97.9 F (36.6 C) 97.9 F (36.6 C) 98.3 F (36.8 C)  TempSrc:  Oral Oral Oral  SpO2: 91% 93% 92%   Weight:   98.3 kg (216 lb 12.8 oz)   Height:        Intake/Output Summary (Last 24 hours) at 02/26/16 1413 Last data filed at 02/26/16 1300  Gross per 24 hour  Intake              520 ml  Output                0 ml  Net              520 ml     Wt Readings from Last 3 Encounters:  02/26/16 98.3 kg (216 lb 12.8 oz)  01/14/16 106.4 kg (234 lb 9.1 oz)     Exam  General: confused, moving around in  bed  Cardiovascular: S1 S2 Clear, irregularly irregular  Respiratory: Clear to auscultation bilaterally, no wheezing, rales or rhonchi  Gastrointestinal: Soft, nontender, nondistended, + bowel sounds  Ext: no cyanosis clubbing or edema  Neuro:No focal neurological deficits    Data Reviewed:  I have personally reviewed following labs and imaging studies  Micro Results Recent Results (from the past 240 hour(s))  Urine culture     Status: Abnormal   Collection Time: 02/24/16  1:28 PM  Result Value Ref Range Status   Specimen Description URINE, RANDOM  Final   Special Requests NONE  Final   Culture MULTIPLE SPECIES PRESENT, SUGGEST RECOLLECTION (A)  Final   Report Status 02/26/2016 FINAL  Final    Radiology Reports Dg Tibia/fibula Right  Result Date: 02/24/2016 CLINICAL DATA:  Status post fall.  Right leg pain. EXAM: RIGHT TIBIA AND FIBULA - 2 VIEW COMPARISON:  None. FINDINGS: Generalized osteopenia. No acute fracture or dislocation. No periosteal reaction or bone destruction. Peripheral vascular atherosclerotic disease. No soft tissue abnormality. IMPRESSION: No acute osseous injury of the right tibia or fibula. Electronically Signed   By: Elige Ko   On: 02/24/2016 11:28   Ct Head Wo Contrast  Result Date: 02/24/2016 CLINICAL DATA:  Numerous recent falls at nursing home. Complains of right-sided headache. Patient is on anticoagulation. EXAM: CT HEAD WITHOUT CONTRAST CT CERVICAL SPINE WITHOUT CONTRAST TECHNIQUE: Multidetector CT imaging of the head and cervical spine was performed following the standard protocol without intravenous contrast. Multiplanar CT image reconstructions of the cervical spine were also generated. COMPARISON:  None. FINDINGS: CT HEAD FINDINGS Brain: Mild cerebral atrophy. Low-density in the periventricular matter suggesting chronic changes. No evidence for acute hemorrhage, mass lesion, midline shift, hydrocephalus or large infarct. Vascular: No hyperdense  vessel or unexpected calcification. Skull: No calvarial fracture Sinuses/Orbits: Deformity of the nasal septum with the apex towards the left. This may be related to old injury. Mild mucosal disease in the maxillary sinuses. Mastoids are clear. Other: Mild swelling on the right side of the scalp. CT CERVICAL SPINE FINDINGS Alignment: Alignment is within normal limits. Skull base and vertebrae: Negative for an acute  fracture or dislocation. Diffuse osteopenia. Soft tissues and spinal canal: No prevertebral fluid or swelling. No visible canal hematoma. Disc levels: Disc space narrowing and C2-C3. Intervertebral body fusion at C3-C4. Complete loss of the disc space with ankylosis at C5-C6. Severe disc space narrowing at C6-C7. Multilevel degenerative facet disease. Facet ankylosis bilaterally at C5-C6. Upper chest: No pneumothorax. Other: None IMPRESSION: CT head: No acute intracranial abnormality. Atrophy with chronic small vessel ischemic changes. CT cervical spine: Multilevel degenerative disease as described. No acute bone abnormality in cervical spine. Electronically Signed   By: Richarda OverlieAdam  Henn M.D.   On: 02/24/2016 12:22   Ct Cervical Spine Wo Contrast  Result Date: 02/24/2016 CLINICAL DATA:  Numerous recent falls at nursing home. Complains of right-sided headache. Patient is on anticoagulation. EXAM: CT HEAD WITHOUT CONTRAST CT CERVICAL SPINE WITHOUT CONTRAST TECHNIQUE: Multidetector CT imaging of the head and cervical spine was performed following the standard protocol without intravenous contrast. Multiplanar CT image reconstructions of the cervical spine were also generated. COMPARISON:  None. FINDINGS: CT HEAD FINDINGS Brain: Mild cerebral atrophy. Low-density in the periventricular matter suggesting chronic changes. No evidence for acute hemorrhage, mass lesion, midline shift, hydrocephalus or large infarct. Vascular: No hyperdense vessel or unexpected calcification. Skull: No calvarial fracture  Sinuses/Orbits: Deformity of the nasal septum with the apex towards the left. This may be related to old injury. Mild mucosal disease in the maxillary sinuses. Mastoids are clear. Other: Mild swelling on the right side of the scalp. CT CERVICAL SPINE FINDINGS Alignment: Alignment is within normal limits. Skull base and vertebrae: Negative for an acute fracture or dislocation. Diffuse osteopenia. Soft tissues and spinal canal: No prevertebral fluid or swelling. No visible canal hematoma. Disc levels: Disc space narrowing and C2-C3. Intervertebral body fusion at C3-C4. Complete loss of the disc space with ankylosis at C5-C6. Severe disc space narrowing at C6-C7. Multilevel degenerative facet disease. Facet ankylosis bilaterally at C5-C6. Upper chest: No pneumothorax. Other: None IMPRESSION: CT head: No acute intracranial abnormality. Atrophy with chronic small vessel ischemic changes. CT cervical spine: Multilevel degenerative disease as described. No acute bone abnormality in cervical spine. Electronically Signed   By: Richarda OverlieAdam  Henn M.D.   On: 02/24/2016 12:22   Ct Abdomen Pelvis W Contrast  Result Date: 02/24/2016 CLINICAL DATA:  Multiple falls, soft tissue hematoma, RIGHT hip pain, abdominal pain, dementia, hypertension, atrial fibrillation, on Xarelto EXAM: CT ABDOMEN AND PELVIS WITH CONTRAST TECHNIQUE: Multidetector CT imaging of the abdomen and pelvis was performed using the standard protocol following bolus administration of intravenous contrast. Sagittal and coronal MPR images reconstructed from axial data set. CONTRAST:  80 cc Isovue 300 IV.  Oral contrast was not administered. COMPARISON:  01/14/2016 FINDINGS: Lower chest: Bibasilar atelectasis. Peripheral rounded opacity LEFT lower lobe 20 x 14 mm image 14, 18 x 13 mm on previous exam, uncertain if represents nodule or rounded atelectasis. Hepatobiliary: Gallbladder and liver normal appearance Pancreas: Normal appearance Spleen: Normal appearance  Adrenals/Urinary Tract: Adrenal glands normal appearance. Tiny nonobstructing LEFT renal calculus. Small BILATERAL renal cysts. Unremarkable ureters. Few small bladder diverticula. Mild prostatic enlargement, gland 5.3 x 3.9 cm image 88. Stomach/Bowel: Appendix not visualized. Diverticulosis of sigmoid colon without evidence of diverticulitis. Tiny hiatal hernia. Stomach and bowel loops otherwise normal appearance for exam limited by lack of oral contrast and adequate distention Vascular/Lymphatic: Atherosclerotic calcifications aorta and coronary arteries. Aorta normal caliber. No adenopathy. Reproductive: N/A Other: Infiltrative changes superior to the pubic symphysis and superior pubic rami likely representing hemorrhage  within the space of Retzius. No free air or free fluid. Tiny umbilical hernia containing fat. Musculoskeletal: Nondisplaced fractures of the RIGHT superior and inferior pubic rami, likely accounting for the suprapubic hematoma. Cannot completely exclude avulsion injury at the rectus abdominis insertions. Advanced degenerative changes of both hip joints. Osseous demineralization with multilevel degenerative disc and facet disease changes of the thoracolumbar spine. Severe multifactorial central acquired spinal stenosis at L1-L2, slightly less severe at T12-L1 and L2-L3. IMPRESSION: No acute intra-abdominal or intrapelvic abnormalities. Mild prostatic enlargement with note of multiple small bladder diverticula. Tiny nonobstructing LEFT renal calculus. Sigmoid diverticulosis. Nondisplaced fractures RIGHT superior and inferior pubic rami with associated suprapubic hematoma/infiltrative changes in the prevesical space, difficult to entirely exclude coexistent injuries at the rectus abdominis insertions. Multilevel severe central acquired spinal stenosis T12-L1, L1-L2, and L2-L3 as above. Aortic atherosclerosis and coronary arterial calcification. Electronically Signed   By: Ulyses Southward M.D.   On:  02/24/2016 12:28   Dg Chest Port 1 View  Result Date: 02/24/2016 CLINICAL DATA:  Shortness of breath and cough EXAM: PORTABLE CHEST 1 VIEW COMPARISON:  None. FINDINGS: Cardiac shadow is within normal limits. Aortic calcifications are seen. The lungs are hypoinflated but clear. No acute bony abnormality is seen. IMPRESSION: Aortic atherosclerosis. No acute abnormality noted. Electronically Signed   By: Alcide Clever M.D.   On: 02/24/2016 15:24   Dg Hip Unilat With Pelvis 2-3 Views Right  Result Date: 02/24/2016 CLINICAL DATA:  Status post fall.  Right hip pain. EXAM: DG HIP (WITH OR WITHOUT PELVIS) 2-3V RIGHT COMPARISON:  None. FINDINGS: Severe osteopenia. No acute fracture or dislocation. Severe osteoarthritis of the right hip with joint space narrowing, marginal osteophytosis and subchondral sclerosis. Moderate osteoarthritis of the left hip. Osteoarthritis of bilateral sacroiliac joints. Degenerative disc disease with disc height loss at L4-5 and L5-S1. IMPRESSION: 1. No acute osseous injury of the right hip. Given the patient's age and osteopenia, if there is persistent clinical concern for an occult hip fracture, then a MRI of the hip is recommended for increased sensitivity. 2. Severe osteoarthritis of the right hip. Electronically Signed   By: Elige Ko   On: 02/24/2016 11:27   Dg Femur Min 2 Views Right  Result Date: 02/24/2016 CLINICAL DATA:  Status post fall.  Right hip pain. EXAM: RIGHT FEMUR 2 VIEWS COMPARISON:  None. FINDINGS: Generalized osteopenia. No fracture or dislocation. Severe osteoarthritis of the right hip joint space narrowing, marginal osteophytosis and subchondral sclerosis. No soft tissue abnormality. IMPRESSION: No acute osseous injury of the right femur. Given the patient's age and osteopenia, if there is persistent clinical concern for an occult hip fracture, a MRI of the hip is recommended for increased sensitivity. Electronically Signed   By: Elige Ko   On: 02/24/2016  11:30    Lab Data:  CBC:  Recent Labs Lab 02/24/16 1041 02/25/16 0626  WBC 13.4* 11.1*  NEUTROABS 11.0*  --   HGB 14.0 12.7*  HCT 41.5 38.7*  MCV 99.8 102.1*  PLT 211 194   Basic Metabolic Panel:  Recent Labs Lab 02/24/16 1041 02/25/16 0626  NA 141 140  K 3.9 3.9  CL 103 104  CO2 27 28  GLUCOSE 111* 111*  BUN 16 11  CREATININE 1.04 0.92  CALCIUM 10.1 9.5   GFR: Estimated Creatinine Clearance: 64.5 mL/min (by C-G formula based on SCr of 0.92 mg/dL). Liver Function Tests:  Recent Labs Lab 02/24/16 1041  AST 28  ALT 30  ALKPHOS 89  BILITOT 1.1  PROT 7.9  ALBUMIN 3.7    Recent Labs Lab 02/24/16 1041  LIPASE 65*   No results for input(s): AMMONIA in the last 168 hours. Coagulation Profile: No results for input(s): INR, PROTIME in the last 168 hours. Cardiac Enzymes:  Recent Labs Lab 02/24/16 1857 02/25/16 0010 02/25/16 0626  TROPONINI <0.03 <0.03 <0.03   BNP (last 3 results) No results for input(s): PROBNP in the last 8760 hours. HbA1C: No results for input(s): HGBA1C in the last 72 hours. CBG: No results for input(s): GLUCAP in the last 168 hours. Lipid Profile: No results for input(s): CHOL, HDL, LDLCALC, TRIG, CHOLHDL, LDLDIRECT in the last 72 hours. Thyroid Function Tests:  Recent Labs  02/24/16 1857  TSH 0.734   Anemia Panel: No results for input(s): VITAMINB12, FOLATE, FERRITIN, TIBC, IRON, RETICCTPCT in the last 72 hours. Urine analysis:    Component Value Date/Time   COLORURINE YELLOW 02/24/2016 1328   APPEARANCEUR CLEAR 02/24/2016 1328   LABSPEC <1.005 (L) 02/24/2016 1328   PHURINE 6.5 02/24/2016 1328   GLUCOSEU NEGATIVE 02/24/2016 1328   HGBUR TRACE (A) 02/24/2016 1328   BILIRUBINUR NEGATIVE 02/24/2016 1328   KETONESUR NEGATIVE 02/24/2016 1328   PROTEINUR NEGATIVE 02/24/2016 1328   NITRITE NEGATIVE 02/24/2016 1328   LEUKOCYTESUR NEGATIVE 02/24/2016 1328     Deaven Urwin U Michal Strzelecki DO. [facility] 02/26/2016, 2:13  PM  Pager: [phone]  After 7pm go to [email or web address] - password [email or web address]  Call night coverage person covering after 7pm

## 2016-02-26 NOTE — Clinical Social Work Placement (Signed)
   CLINICAL SOCIAL WORK PLACEMENT  NOTE  Date:  02/26/2016  Patient Details  Name: Philip Morse MRN: [id number] Date of Birth: 06/24/1925  Clinical Social Work is seeking post-discharge placement for this patient at the Skilled  Nursing Facility level of care (*CSW will initial, date and re-position this form in  chart as items are completed):  Yes   Patient/family provided with [facility] Clinical Social Work Department's list of facilities offering this level of care within the geographic area requested by the patient (or if unable, by the patient's family).  Yes   Patient/family informed of their freedom to choose among providers that offer the needed level of care, that participate in Medicare, Medicaid or managed care program needed by the patient, have an available bed and are willing to accept the patient.  Yes   Patient/family informed of [facility]'s ownership interest in [facility] and [facility], as well as of the fact that they are under no obligation to receive care at these facilities.  PASRR submitted to EDS on 02/25/16     PASRR number received on 02/25/16     Existing PASRR number confirmed on       FL2 transmitted to all facilities in geographic area requested by pt/family on 02/26/16     FL2 transmitted to all facilities within larger geographic area on       Patient informed that his/her managed care company has contracts with or will negotiate with certain facilities, including the following:        Yes   Patient/family informed of bed offers received.  [facility]     Physician recommends and patient chooses bed at      [name]  .  [name] to facility by Ambulance     Patient family notified on   of transfer.  Name of family member notified:        PHYSICIAN Please prepare priority discharge summary, including medications, Please prepare prescriptions,  Please sign FL2     Additional Comment:    _______________________________________________ Venita Lickampbell, Aydon Swamy B, LCSW 02/26/2016, 1:27 PM

## 2016-02-27 DIAGNOSIS — I4891 Unspecified atrial fibrillation: Secondary | ICD-10-CM

## 2016-02-27 DIAGNOSIS — F0390 Unspecified dementia without behavioral disturbance: Secondary | ICD-10-CM

## 2016-02-27 DIAGNOSIS — I10 Essential (primary) hypertension: Secondary | ICD-10-CM

## 2016-02-27 DIAGNOSIS — E784 Other hyperlipidemia: Secondary | ICD-10-CM

## 2016-02-27 LAB — CBC
HCT: 36.9 % — ABNORMAL LOW (ref 39.0–52.0)
HEMOGLOBIN: 12 g/dL — AB (ref 13.0–17.0)
MCH: 33.1 pg (ref 26.0–34.0)
MCHC: 32.5 g/dL (ref 30.0–36.0)
MCV: 101.7 fL — AB (ref 78.0–100.0)
PLATELETS: 170 10*3/uL (ref 150–400)
RBC: 3.63 MIL/uL — AB (ref 4.22–5.81)
RDW: 13.7 % (ref 11.5–15.5)
WBC: 10.7 10*3/uL — AB (ref 4.0–10.5)

## 2016-02-27 LAB — BASIC METABOLIC PANEL
ANION GAP: 10 (ref 5–15)
BUN: 16 mg/dL (ref 6–20)
CALCIUM: 9.3 mg/dL (ref 8.9–10.3)
CO2: 29 mmol/L (ref 22–32)
CREATININE: 1.06 mg/dL (ref 0.61–1.24)
Chloride: 101 mmol/L (ref 101–111)
GFR calc Af Amer: >60 mL/min (ref >60)
GLUCOSE: 108 mg/dL — AB (ref 65–99)
Potassium: 4.1 mmol/L (ref 3.5–5.1)
Sodium: 140 mmol/L (ref 135–145)

## 2016-02-27 MED ORDER — ACETAMINOPHEN 325 MG PO TABS: 650.0000 mg | ORAL_TABLET | Freq: Four times a day (QID) | ORAL | Status: AC | PRN

## 2016-02-27 MED ORDER — IPRATROPIUM BROMIDE 0.02 % IN SOLN: 0.5000 mg | Freq: Four times a day (QID) | RESPIRATORY_TRACT | 12 refills | Status: AC | PRN

## 2016-02-27 MED ORDER — HYDROCODONE-ACETAMINOPHEN 5-325 MG PO TABS: 1.0000 | ORAL_TABLET | ORAL | 0 refills | Status: AC | PRN

## 2016-02-27 NOTE — Clinical Social Work Placement (Signed)
   CLINICAL SOCIAL WORK PLACEMENT  NOTE  Date:  02/27/2016  Patient Details  Name: Philip Morse MRN: [id number] Date of Birth: 1926-01-06  Clinical Social Work is seeking post-discharge placement for this patient at the Skilled  Nursing Facility level of care (*CSW will initial, date and re-position this form in  chart as items are completed):  Yes   Patient/family provided with [facility] Clinical Social Work Department's list of facilities offering this level of care within the geographic area requested by the patient (or if unable, by the patient's family).  Yes   Patient/family informed of their freedom to choose among providers that offer the needed level of care, that participate in Medicare, Medicaid or managed care program needed by the patient, have an available bed and are willing to accept the patient.  Yes   Patient/family informed of 's ownership interest in [facility] and [facility], as well as of the fact that they are under no obligation to receive care at these facilities.  PASRR submitted to EDS on 02/25/16     PASRR number received on 02/25/16     Existing PASRR number confirmed on       FL2 transmitted to all facilities in geographic area requested by pt/family on 02/26/16     FL2 transmitted to all facilities within larger geographic area on       Patient informed that his/her managed care company has contracts with or will negotiate with certain facilities, including the following:        Yes   Patient/family informed of bed offers received.  [facility]     Physician recommends and patient chooses bed at      [name] 02[facility].  [name] to facility by Ambulance     Patient family notified on 02/27/16 of transfer.  Name of family member notified:  Amanda PeaLori Pope     PHYSICIAN Please prepare priority discharge summary, including medications, Please  prepare prescriptions, Please sign FL2     Additional Comment:   Per MD [name] to [facility]. RN, patient, patient's family, and facility notified of DC. RN given number for report. DC packet on chart. Ambulance transport requested for patient 11AM. CSW signing off.  _______________________________________________ Venita Lickampbell, Rani Sisney B, LCSW 02/27/2016, 9:51 AM

## 2016-02-27 NOTE — Progress Notes (Signed)
Attempted [facility] to [facility]. No answer. Will try again  Genelle Balameron D Kenshin Splawn, RN

## 2016-02-27 NOTE — Discharge Summary (Signed)
Physician Discharge Summary  Wessley Emert UEA:[id number] DOB: [birth year] DOA: 02/24/2016  PCP: Eartha Inch, MD  Admit date: 02/24/2016 Discharge date: 02/27/2016   Recommendations for Outpatient Follow-Up:   1. May benefit from Palliative Care consult as outpatient as dementia appears severe and patient listed as full code-- wife disabled and unable to travel to hospital   Discharge Diagnosis:   Principal Problem:   Atrial fibrillation with RVR (HCC) Active Problems:   Fracture of pubic rami, right   Falls   HTN (hypertension)   HLD (hyperlipidemia)   Dementia   Pubic ramus fracture (HCC)   Pressure injury of skin   Discharge disposition:  SNF:  Discharge Condition: Improved.  Diet recommendation: Low sodium, heart healthy  Wound care: None.   History of Present Illness:   Patient is a 82 year old male with history of atrial fibrillation on a xarelto, hypertension, hyperlipidemia, dementia presented to ED with multiple falls from home. History was obtained from his wife, patient is a poor historian due to dementia. Per his wife, he fell, described as "rolled from his bed" around 2 AM in the morning. She had to call for assistance to get patient back into the bed. Subsequently this morning when he woke up and got out of the bed. He was walking with assistance of his walker when his right knee gave out and patient fell on his right side and tripped over his walker. Per wife, he had no dizziness, lightheadedness or syncopal episode. No chest pain or shortness of breath. She states that he did hit his head. EMS was called and patient was brought to ED, placed in C-spine. Patient complaint of right leg pain radiating to right hip, unable to lift his right leg, difficult to put weight on his right foot. At the time of my examination during the encounter, patient in Afib RVR, HR 130's   Hospital Course by Problem:   Atrial fibrillation with RVR (HCC): RVR precipitated  likely due to pain, anxiety - Cardiac enzymes negative, TSH 0.7, heart rate improving, 90-low 100's - Received Cardizem 10 mg 1 in ED, placed back on beta blocker - Hold anticoagulation given pubic rami fracture and hematoma, not a good candidate for anticoagulation given multiple falls  Fracture of pubic rami, right - Orthopedics consulted, recommended nonoperative management -maybe bear weight on the right lower extremity, pain management   Falls - Patient had 2 falls on the day of admission and having multiple falls recently, not a good candidate for anticoagulation at this time - SNF placement  HTN (hypertension) - BP better controlled, continue beta blocker  Hypothyroidism - TSH 0.7, continue Synthroid  Dementia - Continue Namenda    Medical Consultants:    ortho.   Discharge Exam:   Vitals:   02/26/16 2027 02/27/16 0319  BP: 101/61 (!) 140/96  Pulse: 86 83  Resp: 17 16  Temp: 98.3 F (36.8 C) 98.1 F (36.7 C)   Vitals:   02/26/16 0800 02/26/16 1357 02/26/16 2027 02/27/16 0319  BP:   101/61 (!) 140/96  Pulse: 96  86 83  Resp: (!) 27  17 16   Temp:  98.3 F (36.8 C) 98.3 F (36.8 C) 98.1 F (36.7 C)  TempSrc:  Oral Oral Oral  SpO2: 94%  96% 95%  Weight:    98.8 kg (217 lb 14.4 oz)  Height:        Gen:  NAD Cardiovascular:  RRR, No M/R/G Respiratory: Lungs CTAB Gastrointestinal: Abdomen soft, NT/ND with normal  active bowel sounds. Extremities: No C/E/C   The results of significant diagnostics from this hospitalization (including imaging, microbiology, ancillary and laboratory) are listed below for reference.     Procedures and Diagnostic Studies:   Dg Tibia/fibula Right  Result Date: 02/24/2016 CLINICAL DATA:  Status post fall.  Right leg pain. EXAM: RIGHT TIBIA AND FIBULA - 2 VIEW COMPARISON:  None. FINDINGS: Generalized osteopenia. No acute fracture or dislocation. No periosteal reaction or bone destruction. Peripheral vascular  atherosclerotic disease. No soft tissue abnormality. IMPRESSION: No acute osseous injury of the right tibia or fibula. Electronically Signed   By: Elige KoHetal  Patel   On: 02/24/2016 11:28   Ct Head Wo Contrast  Result Date: 02/24/2016 CLINICAL DATA:  Numerous recent falls at nursing home. Complains of right-sided headache. Patient is on anticoagulation. EXAM: CT HEAD WITHOUT CONTRAST CT CERVICAL SPINE WITHOUT CONTRAST TECHNIQUE: Multidetector CT imaging of the head and cervical spine was performed following the standard protocol without intravenous contrast. Multiplanar CT image reconstructions of the cervical spine were also generated. COMPARISON:  None. FINDINGS: CT HEAD FINDINGS Brain: Mild cerebral atrophy. Low-density in the periventricular matter suggesting chronic changes. No evidence for acute hemorrhage, mass lesion, midline shift, hydrocephalus or large infarct. Vascular: No hyperdense vessel or unexpected calcification. Skull: No calvarial fracture Sinuses/Orbits: Deformity of the nasal septum with the apex towards the left. This may be related to old injury. Mild mucosal disease in the maxillary sinuses. Mastoids are clear. Other: Mild swelling on the right side of the scalp. CT CERVICAL SPINE FINDINGS Alignment: Alignment is within normal limits. Skull base and vertebrae: Negative for an acute fracture or dislocation. Diffuse osteopenia. Soft tissues and spinal canal: No prevertebral fluid or swelling. No visible canal hematoma. Disc levels: Disc space narrowing and C2-C3. Intervertebral body fusion at C3-C4. Complete loss of the disc space with ankylosis at C5-C6. Severe disc space narrowing at C6-C7. Multilevel degenerative facet disease. Facet ankylosis bilaterally at C5-C6. Upper chest: No pneumothorax. Other: None IMPRESSION: CT head: No acute intracranial abnormality. Atrophy with chronic small vessel ischemic changes. CT cervical spine: Multilevel degenerative disease as described. No acute bone  abnormality in cervical spine. Electronically Signed   By: Richarda OverlieAdam  Henn M.D.   On: 02/24/2016 12:22   Ct Cervical Spine Wo Contrast  Result Date: 02/24/2016 CLINICAL DATA:  Numerous recent falls at nursing home. Complains of right-sided headache. Patient is on anticoagulation. EXAM: CT HEAD WITHOUT CONTRAST CT CERVICAL SPINE WITHOUT CONTRAST TECHNIQUE: Multidetector CT imaging of the head and cervical spine was performed following the standard protocol without intravenous contrast. Multiplanar CT image reconstructions of the cervical spine were also generated. COMPARISON:  None. FINDINGS: CT HEAD FINDINGS Brain: Mild cerebral atrophy. Low-density in the periventricular matter suggesting chronic changes. No evidence for acute hemorrhage, mass lesion, midline shift, hydrocephalus or large infarct. Vascular: No hyperdense vessel or unexpected calcification. Skull: No calvarial fracture Sinuses/Orbits: Deformity of the nasal septum with the apex towards the left. This may be related to old injury. Mild mucosal disease in the maxillary sinuses. Mastoids are clear. Other: Mild swelling on the right side of the scalp. CT CERVICAL SPINE FINDINGS Alignment: Alignment is within normal limits. Skull base and vertebrae: Negative for an acute fracture or dislocation. Diffuse osteopenia. Soft tissues and spinal canal: No prevertebral fluid or swelling. No visible canal hematoma. Disc levels: Disc space narrowing and C2-C3. Intervertebral body fusion at C3-C4. Complete loss of the disc space with ankylosis at C5-C6. Severe disc space narrowing at C6-C7.  Multilevel degenerative facet disease. Facet ankylosis bilaterally at C5-C6. Upper chest: No pneumothorax. Other: None IMPRESSION: CT head: No acute intracranial abnormality. Atrophy with chronic small vessel ischemic changes. CT cervical spine: Multilevel degenerative disease as described. No acute bone abnormality in cervical spine. Electronically Signed   By: Richarda Overlie M.D.    On: 02/24/2016 12:22   Ct Abdomen Pelvis W Contrast  Result Date: 02/24/2016 CLINICAL DATA:  Multiple falls, soft tissue hematoma, RIGHT hip pain, abdominal pain, dementia, hypertension, atrial fibrillation, on Xarelto EXAM: CT ABDOMEN AND PELVIS WITH CONTRAST TECHNIQUE: Multidetector CT imaging of the abdomen and pelvis was performed using the standard protocol following bolus administration of intravenous contrast. Sagittal and coronal MPR images reconstructed from axial data set. CONTRAST:  80 cc Isovue 300 IV.  Oral contrast was not administered. COMPARISON:  01/14/2016 FINDINGS: Lower chest: Bibasilar atelectasis. Peripheral rounded opacity LEFT lower lobe 20 x 14 mm image 14, 18 x 13 mm on previous exam, uncertain if represents nodule or rounded atelectasis. Hepatobiliary: Gallbladder and liver normal appearance Pancreas: Normal appearance Spleen: Normal appearance Adrenals/Urinary Tract: Adrenal glands normal appearance. Tiny nonobstructing LEFT renal calculus. Small BILATERAL renal cysts. Unremarkable ureters. Few small bladder diverticula. Mild prostatic enlargement, gland 5.3 x 3.9 cm image 88. Stomach/Bowel: Appendix not visualized. Diverticulosis of sigmoid colon without evidence of diverticulitis. Tiny hiatal hernia. Stomach and bowel loops otherwise normal appearance for exam limited by lack of oral contrast and adequate distention Vascular/Lymphatic: Atherosclerotic calcifications aorta and coronary arteries. Aorta normal caliber. No adenopathy. Reproductive: N/A Other: Infiltrative changes superior to the pubic symphysis and superior pubic rami likely representing hemorrhage within the space of Retzius. No free air or free fluid. Tiny umbilical hernia containing fat. Musculoskeletal: Nondisplaced fractures of the RIGHT superior and inferior pubic rami, likely accounting for the suprapubic hematoma. Cannot completely exclude avulsion injury at the rectus abdominis insertions. Advanced degenerative  changes of both hip joints. Osseous demineralization with multilevel degenerative disc and facet disease changes of the thoracolumbar spine. Severe multifactorial central acquired spinal stenosis at L1-L2, slightly less severe at T12-L1 and L2-L3. IMPRESSION: No acute intra-abdominal or intrapelvic abnormalities. Mild prostatic enlargement with note of multiple small bladder diverticula. Tiny nonobstructing LEFT renal calculus. Sigmoid diverticulosis. Nondisplaced fractures RIGHT superior and inferior pubic rami with associated suprapubic hematoma/infiltrative changes in the prevesical space, difficult to entirely exclude coexistent injuries at the rectus abdominis insertions. Multilevel severe central acquired spinal stenosis T12-L1, L1-L2, and L2-L3 as above. Aortic atherosclerosis and coronary arterial calcification. Electronically Signed   By: Ulyses Southward M.D.   On: 02/24/2016 12:28   Dg Chest Port 1 View  Result Date: 02/24/2016 CLINICAL DATA:  Shortness of breath and cough EXAM: PORTABLE CHEST 1 VIEW COMPARISON:  None. FINDINGS: Cardiac shadow is within normal limits. Aortic calcifications are seen. The lungs are hypoinflated but clear. No acute bony abnormality is seen. IMPRESSION: Aortic atherosclerosis. No acute abnormality noted. Electronically Signed   By: Alcide Clever M.D.   On: 02/24/2016 15:24   Dg Hip Unilat With Pelvis 2-3 Views Right  Result Date: 02/24/2016 CLINICAL DATA:  Status post fall.  Right hip pain. EXAM: DG HIP (WITH OR WITHOUT PELVIS) 2-3V RIGHT COMPARISON:  None. FINDINGS: Severe osteopenia. No acute fracture or dislocation. Severe osteoarthritis of the right hip with joint space narrowing, marginal osteophytosis and subchondral sclerosis. Moderate osteoarthritis of the left hip. Osteoarthritis of bilateral sacroiliac joints. Degenerative disc disease with disc height loss at L4-5 and L5-S1. IMPRESSION: 1. No acute osseous  injury of the right hip. Given the patient's age and  osteopenia, if there is persistent clinical concern for an occult hip fracture, then a MRI of the hip is recommended for increased sensitivity. 2. Severe osteoarthritis of the right hip. Electronically Signed   By: Elige Ko   On: 02/24/2016 11:27   Dg Femur Min 2 Views Right  Result Date: 02/24/2016 CLINICAL DATA:  Status post fall.  Right hip pain. EXAM: RIGHT FEMUR 2 VIEWS COMPARISON:  None. FINDINGS: Generalized osteopenia. No fracture or dislocation. Severe osteoarthritis of the right hip joint space narrowing, marginal osteophytosis and subchondral sclerosis. No soft tissue abnormality. IMPRESSION: No acute osseous injury of the right femur. Given the patient's age and osteopenia, if there is persistent clinical concern for an occult hip fracture, a MRI of the hip is recommended for increased sensitivity. Electronically Signed   By: Elige Ko   On: 02/24/2016 11:30     Labs:   Basic Metabolic Panel:  Recent Labs Lab 02/24/16 1041 02/25/16 0626 02/27/16 0538  NA 141 140 140  K 3.9 3.9 4.1  CL 103 104 101  CO2 27 28 29   GLUCOSE 111* 111* 108*  BUN 16 11 16   CREATININE 1.04 0.92 1.06  CALCIUM 10.1 9.5 9.3   GFR Estimated Creatinine Clearance: 60.5 mL/min (by C-G formula based on SCr of 1.06 mg/dL). Liver Function Tests:  Recent Labs Lab 02/24/16 1041  AST 28  ALT 30  ALKPHOS 89  BILITOT 1.1  PROT 7.9  ALBUMIN 3.7    Recent Labs Lab 02/24/16 1041  LIPASE 65*   No results for input(s): AMMONIA in the last 168 hours. Coagulation profile No results for input(s): INR, PROTIME in the last 168 hours.  CBC:  Recent Labs Lab 02/24/16 1041 02/25/16 0626 02/27/16 0538  WBC 13.4* 11.1* 10.7*  NEUTROABS 11.0*  --   --   HGB 14.0 12.7* 12.0*  HCT 41.5 38.7* 36.9*  MCV 99.8 102.1* 101.7*  PLT 211 194 170   Cardiac Enzymes:  Recent Labs Lab 02/24/16 1857 02/25/16 0010 02/25/16 0626  TROPONINI <0.03 <0.03 <0.03   BNP: Invalid input(s):  POCBNP CBG: No results for input(s): GLUCAP in the last 168 hours. D-Dimer No results for input(s): DDIMER in the last 72 hours. Hgb A1c No results for input(s): HGBA1C in the last 72 hours. Lipid Profile No results for input(s): CHOL, HDL, LDLCALC, TRIG, CHOLHDL, LDLDIRECT in the last 72 hours. Thyroid function studies  Recent Labs  02/24/16 1857  TSH 0.734   Anemia work up No results for input(s): VITAMINB12, FOLATE, FERRITIN, TIBC, IRON, RETICCTPCT in the last 72 hours. Microbiology Recent Results (from the past 240 hour(s))  Urine culture     Status: Abnormal   Collection Time: 02/24/16  1:28 PM  Result Value Ref Range Status   Specimen Description URINE, RANDOM  Final   Special Requests NONE  Final   Culture MULTIPLE SPECIES PRESENT, SUGGEST RECOLLECTION (A)  Final   Report Status 02/26/2016 FINAL  Final     Discharge Instructions:   Discharge Instructions    Diet - low sodium heart healthy    Complete by:  As directed    Increase activity slowly    Complete by:  As directed        Medication List    STOP taking these medications   rivaroxaban 20 MG Tabs tablet Commonly known as:  XARELTO   torsemide 20 MG tablet Commonly known as:  DEMADEX  TAKE these medications   acetaminophen 325 MG tablet Commonly known as:  TYLENOL Take 2 tablets (650 mg total) by mouth every 6 (six) hours as needed for mild pain (or Fever >/= 101).   guaiFENesin 600 MG 12 hr tablet Commonly known as:  MUCINEX Take 1 tablet (600 mg total) by mouth 2 (two) times daily as needed for cough or to loosen phlegm.   HYDROcodone-acetaminophen 5-325 MG tablet Commonly known as:  NORCO/VICODIN Take 1-2 tablets by mouth every 4 (four) hours as needed for moderate pain.   ipratropium 0.02 % nebulizer solution Commonly known as:  ATROVENT Take 2.5 mLs (0.5 mg total) by nebulization every 6 (six) hours as needed for wheezing or shortness of breath.   levothyroxine 112 MCG  tablet Commonly known as:  SYNTHROID, LEVOTHROID Take 112 mcg by mouth daily before breakfast.   memantine 10 MG tablet Commonly known as:  NAMENDA Take 10 mg by mouth 2 (two) times daily.   metoprolol succinate 50 MG 24 hr tablet Commonly known as:  TOPROL-XL Take 50 mg by mouth daily. Take with or immediately following a meal.   PHOSPHA 250 NEUTRAL [id number] MG tablet Generic drug:  phosphorus Take 500 mg by mouth daily.      Follow-up Information    BADGER,MICHAEL C, MD Follow up in 1 week(s).   Specialty:  Family Medicine Contact information: [address] [place] [address] [phone]            Time coordinating discharge: 35 min  Signed:  Marlyce Mcdougald Juanetta Gosling   [facility] 02/27/2016, 9:26 AM

## 2016-02-27 NOTE — Progress Notes (Signed)
[facility] Staff updated on pt plan of care. Pt discharged to SNF.

## 2016-02-27 NOTE — Care Management Important Message (Signed)
Important Message  Patient Details  Name: Philip LarrySamuel Mccants MRN: [id number] Date of Birth: 04/25/1926   Medicare Important Message Given:  Yes    [name] Abena 02/27/2016, 9:57 AM

## 2016-03-30 ENCOUNTER — Emergency Department (HOSPITAL_COMMUNITY)
Admission: EM | Admit: 2016-03-30 | Discharge: 2016-03-31 | Disposition: A | Discharge status: Home / Self Care | Payer: Medicare Other | Attending: Emergency Medicine | Admitting: Emergency Medicine

## 2016-03-30 ENCOUNTER — Encounter (HOSPITAL_COMMUNITY): Payer: Self-pay

## 2016-03-30 ENCOUNTER — Emergency Department (HOSPITAL_COMMUNITY): Payer: Medicare Other

## 2016-03-30 DIAGNOSIS — I10 Essential (primary) hypertension: Secondary | ICD-10-CM | POA: Insufficient documentation

## 2016-03-30 DIAGNOSIS — J069 Acute upper respiratory infection, unspecified: Secondary | ICD-10-CM | POA: Insufficient documentation

## 2016-03-30 DIAGNOSIS — R05 Cough: Secondary | ICD-10-CM | POA: Diagnosis present

## 2016-03-30 LAB — BASIC METABOLIC PANEL
Anion gap: 11 (ref 5–15)
BUN: 24 mg/dL — ABNORMAL HIGH (ref 6–20)
CO2: 29 mmol/L (ref 22–32)
Calcium: 9.4 mg/dL (ref 8.9–10.3)
Chloride: 102 mmol/L (ref 101–111)
Creatinine, Ser: 1.12 mg/dL (ref 0.61–1.24)
GFR calc Af Amer: >60 mL/min (ref >60)
GFR calc non Af Amer: 57 mL/min — ABNORMAL LOW (ref >60)
Glucose, Bld: 117 mg/dL — ABNORMAL HIGH (ref 65–99)
Potassium: 3.6 mmol/L (ref 3.5–5.1)
Sodium: 142 mmol/L (ref 135–145)

## 2016-03-30 LAB — CBC WITH DIFFERENTIAL/PLATELET
Basophils Absolute: 0 10*3/uL (ref 0.0–0.1)
Basophils Relative: 0 %
Eosinophils Absolute: 0.3 10*3/uL (ref 0.0–0.7)
Eosinophils Relative: 3 %
HCT: 35.9 % — ABNORMAL LOW (ref 39.0–52.0)
Hemoglobin: 12 g/dL — ABNORMAL LOW (ref 13.0–17.0)
Lymphocytes Relative: 16 %
Lymphs Abs: 1.8 10*3/uL (ref 0.7–4.0)
MCH: 33 pg (ref 26.0–34.0)
MCHC: 33.4 g/dL (ref 30.0–36.0)
MCV: 98.6 fL (ref 78.0–100.0)
Monocytes Absolute: 0.9 10*3/uL (ref 0.1–1.0)
Monocytes Relative: 8 %
Neutro Abs: 8.4 10*3/uL — ABNORMAL HIGH (ref 1.7–7.7)
Neutrophils Relative %: 73 %
Platelets: 294 10*3/uL (ref 150–400)
RBC: 3.64 MIL/uL — ABNORMAL LOW (ref 4.22–5.81)
RDW: 13.4 % (ref 11.5–15.5)
WBC: 11.4 10*3/uL — ABNORMAL HIGH (ref 4.0–10.5)

## 2016-03-30 LAB — I-STAT TROPONIN, ED: Troponin i, poc: 0 ng/mL (ref 0.00–0.08)

## 2016-03-30 MED ORDER — ALBUTEROL SULFATE HFA 108 (90 BASE) MCG/ACT IN AERS: 1.0000 | INHALATION_SPRAY | Freq: Four times a day (QID) | RESPIRATORY_TRACT | 0 refills | Status: AC | PRN

## 2016-03-30 MED ORDER — IPRATROPIUM-ALBUTEROL 0.5-2.5 (3) MG/3ML IN SOLN
3.0000 mL | Freq: Once | RESPIRATORY_TRACT | Status: AC
  Administered 2016-03-30: 3 mL via RESPIRATORY_TRACT
  Filled 2016-03-30: qty 3

## 2016-03-30 NOTE — ED Provider Notes (Signed)
[facility]-EMERGENCY DEPT Provider Note   CSN: [id number] Arrival date & time: 03/30/16  2114     History   Chief Complaint Chief Complaint  Patient presents with  . Cough    sent in from PMD for r/o Pneumonia     HPI Philip Morse is a 82 y.o. male who presents with a 3-4 day history of cough with productive, clear sputum. Patient has also had associated shortness of breath. Patient was seen by his PCP who sent him for evaluation for pneumonia. Patient denies any chest pain, fevers, abdominal pain, nausea, vomiting, urinary symptoms. CHA2DS2-VASc score 3.  HPI  Past Medical History:  Diagnosis Date  . Atrial fibrillation (HCC)   . Dementia   . HLD (hyperlipidemia)   . HTN (hypertension)     Patient Active Problem List   Diagnosis Date Noted  . Pressure injury of skin 02/26/2016  . Fracture of pubic rami, right 02/24/2016  . Falls 02/24/2016  . Pubic ramus fracture (HCC) 02/24/2016  . HTN (hypertension)   . HLD (hyperlipidemia)   . Dementia   . Atrial fibrillation with RVR (HCC)   . Transaminitis 01/14/2016  . Leukocytosis 01/14/2016  . Nausea vomiting and diarrhea 01/14/2016    History reviewed. No pertinent surgical history.     Home Medications    Prior to Admission medications   Medication Sig Start Date End Date Taking? Authorizing Provider  acetaminophen (TYLENOL) 325 MG tablet Take 2 tablets (650 mg total) by mouth every 6 (six) hours as needed for mild pain (or Fever >/= 101). 02/27/16  Yes Joseph ArtJessica U Vann, DO  guaiFENesin (MUCINEX) 600 MG 12 hr tablet Take 1 tablet (600 mg total) by mouth 2 (two) times daily as needed for cough or to loosen phlegm. 01/17/16  Yes Barnetta ChapelSylvester I Ogbata, MD  levothyroxine (SYNTHROID, LEVOTHROID) 112 MCG tablet Take 112 mcg by mouth daily before breakfast.   Yes Historical Provider, MD  memantine (NAMENDA) 10 MG tablet Take 10 mg by mouth 2 (two) times daily.   Yes Historical Provider, MD  metoprolol succinate (TOPROL-XL) 50 MG 24 hr  tablet Take 50 mg by mouth daily. Take with or immediately following a meal.   Yes Historical Provider, MD  phosphorus (PHOSPHA 250 NEUTRAL) 155-852-130 MG tablet Take 500 mg by mouth daily.   Yes Historical Provider, MD  albuterol (PROVENTIL HFA;VENTOLIN HFA) 108 (90 Base) MCG/ACT inhaler Inhale 1-2 puffs into the lungs every 6 (six) hours as needed for wheezing or shortness of breath. 03/30/16   Emi HolesAlexandra M Abhishek Levesque, PA-C  HYDROcodone-acetaminophen (NORCO/VICODIN) 5-325 MG tablet Take 1-2 tablets by mouth every 4 (four) hours as needed for moderate pain. Patient not taking: Reported on 03/30/2016 02/27/16   Joseph ArtJessica U Vann, DO  ipratropium (ATROVENT) 0.02 % nebulizer solution Take 2.5 mLs (0.5 mg total) by nebulization every 6 (six) hours as needed for wheezing or shortness of breath. Patient not taking: Reported on 03/30/2016 02/27/16   Joseph ArtJessica U Vann, DO    Family History No family history on file.  Social History Social History  Substance Use Topics  . Smoking status: Never Smoker  . Smokeless tobacco: Never Used  . Alcohol use No     Allergies   Aricept [donepezil hcl]   Review of Systems Review of Systems  Constitutional: Negative for chills and fever.  HENT: Negative for facial swelling and sore throat.   Respiratory: Positive for cough and shortness of breath.   Cardiovascular: Negative for chest pain.  Gastrointestinal: Negative for  abdominal pain, nausea and vomiting.  Genitourinary: Negative for dysuria.  Musculoskeletal: Negative for back pain.  Skin: Negative for rash and wound.  Neurological: Negative for headaches.  Psychiatric/Behavioral: The patient is not nervous/anxious.      Physical Exam Updated Vital Signs BP 126/75 (BP Location: Right Arm)   Pulse 95   Temp 98.5 F (36.9 C) (Oral)   Resp 17   Ht 6' (1.829 m)   Wt 95.3 kg   SpO2 95%   BMI 28.48 kg/m   Physical Exam  Constitutional: He appears well-developed and well-nourished. No distress.  HENT:    Head: Normocephalic and atraumatic.  Mouth/Throat: Oropharynx is clear and moist. No oropharyngeal exudate.  Eyes: Conjunctivae are normal. Pupils are equal, round, and reactive to light. Right eye exhibits no discharge. Left eye exhibits no discharge. No scleral icterus.  Neck: Normal range of motion. Neck supple. No thyromegaly present.  Cardiovascular: Normal rate, regular rhythm, normal heart sounds and intact distal pulses.  Exam reveals no gallop and no friction rub.   No murmur heard. Pulmonary/Chest: Effort normal and breath sounds normal. No stridor. No respiratory distress. He has no wheezes.  Possible rales to upper right lung  Abdominal: Soft. Bowel sounds are normal. He exhibits no distension. There is no tenderness. There is no rebound and no guarding.  Musculoskeletal: He exhibits no edema.  Lymphadenopathy:    He has no cervical adenopathy.  Neurological: He is alert. Coordination normal.  Skin: Skin is warm and dry. No rash noted. He is not diaphoretic. No pallor.  Psychiatric: He has a normal mood and affect.  Nursing note and vitals reviewed.    ED Treatments / Results  Labs (all labs ordered are listed, but only abnormal results are displayed) Labs Reviewed  BASIC METABOLIC PANEL - Abnormal; Notable for the following:       Result Value   Glucose, Bld 117 (*)    BUN 24 (*)    GFR calc non Af Amer 57 (*)    All other components within normal limits  CBC WITH DIFFERENTIAL/PLATELET - Abnormal; Notable for the following:    WBC 11.4 (*)    RBC 3.64 (*)    Hemoglobin 12.0 (*)    HCT 35.9 (*)    Neutro Abs 8.4 (*)    All other components within normal limits  I-STAT TROPOININ, ED    EKG  EKG Interpretation  Date/Time:  Monday March 30 2016 22:06:36 EST Ventricular Rate:  91 PR Interval:    QRS Duration: 100 QT Interval:  372 QTC Calculation: 458 R Axis:   -57 Text Interpretation:  Atrial flutter Incomplete RBBB and LAFB RSR' in V1 or V2, right VCD  or RVH When compared with ECG of 02/24/2016, HEART RATE has decreased Confirmed by Preston Fleeting  MD, DAVID ([id number]) on 03/30/2016 10:11:05 PM       Radiology Dg Chest 2 View  Result Date: 03/30/2016 CLINICAL DATA:  Productive cough for several days. Cough. History of hypertension and dementia. EXAM: CHEST  2 VIEW COMPARISON:  02/24/2016 FINDINGS: Normal heart size and pulmonary vascularity. Diffuse interstitial pattern to the lungs probably representing chronic bronchitic change. No focal airspace disease or consolidation. No blunting of costophrenic angles. No pneumothorax. Calcified and tortuous aorta. Degenerative changes in the spine and shoulders. Examination is technically limited due to motion artifact. IMPRESSION: No evidence of active pulmonary disease. Chronic bronchitic changes in the lungs. Electronically Signed   By: Burman Nieves M.D.   On: 03/30/2016  22:29    Procedures Procedures (including critical care time)  Medications Ordered in ED Medications  ipratropium-albuterol (DUONEB) 0.5-2.5 (3) MG/3ML nebulizer solution 3 mL (3 mLs Nebulization Given 03/30/16 2307)     Initial Impression / Assessment and Plan / ED Course  I have reviewed the triage vital signs and the nursing notes.  Pertinent labs & imaging results that were available during my care of the patient were reviewed by me and considered in my medical decision making (see chart for details).  Clinical Course     EKG shows atrial flutter, RBBB with rate in normal range. CHA2DS2-VASc score 3. Patient is not anticoagulated because of frequent falls. This was seen in last admission note for A. fib with RVR. CBC shows WBC 11.4, hemoglobin 12. BMP shows glucose 117, BUN 24. Troponin 0.00. CXR shows no evidence of active pulmonary disease; chronic bronchitic changes in the lungs. Patient's symptoms improved after DuoNeb. Myself and Dr. Preston FleetingGlick feel the patient is safe for discharge at this time with strict return precautions.  Patient discharged home with albuterol inhaler with spacer. Follow up with PCP, patient has scheduled appointment on Thursday. Patient and family understand and agree with plan. Patient discharged in satisfactory condition. Patient also evaluated by Dr. Preston FleetingGlick who guided the patient's management and agrees with plan.  Final Clinical Impressions(s) / ED Diagnoses   Final diagnoses:  Upper respiratory tract infection, unspecified type    New Prescriptions New Prescriptions   ALBUTEROL (PROVENTIL HFA;VENTOLIN HFA) 108 (90 BASE) MCG/ACT INHALER    Inhale 1-2 puffs into the lungs every 6 (six) hours as needed for wheezing or shortness of breath.     Emi Holeslexandra M Taffy Delconte, PA-C 03/30/16 2351    Dione Boozeavid Glick, MD 03/31/16 [phone]

## 2016-03-30 NOTE — ED Triage Notes (Signed)
Pt sent in from PMD for r/o pneumonia has had a cough for several days with productive mucous

## 2016-03-30 NOTE — Discharge Instructions (Signed)
Medications: albuterol inhaler with space  Treatment: Use inhaler with spacer every 4-6 hours as needed for cough and shortness of breath. You can continue taking Mucinex as long as it does not include pseudoephedrine.  Follow-up: Please follow-up with primary care provider scheduled appointment on Thursday. Please return to emergency department if you develop any new or worsening symptoms including fever over 100.4, increasing shortness of breath, chest pain, altered mental status, or any other concerning symptoms.

## 2016-03-30 NOTE — ED Notes (Signed)
Patient transported to X-ray 

## 2017-05-17 IMAGING — CT CT HEAD W/O CM
3 of 4 series · 14 of 47 positions shown, 16 images · non-contrast
Comparison: None.

CLINICAL DATA: Numerous recent falls at [HOSPITAL]. Complains of
right-sided headache. Patient is on anticoagulation.

EXAM:
CT HEAD WITHOUT CONTRAST
CT CERVICAL SPINE WITHOUT CONTRAST
TECHNIQUE: Multidetector CT imaging of the head and cervical spine was
performed following the standard protocol without intravenous
contrast. Multiplanar CT image reconstructions of the cervical spine
were also generated.

[Series 4: c_spine 2.0 st · axial · 0.27mm/px · z∈[+1148,+1306]mm · 8 of 93 slices shown, 10 images]
[im 7/93  brain]
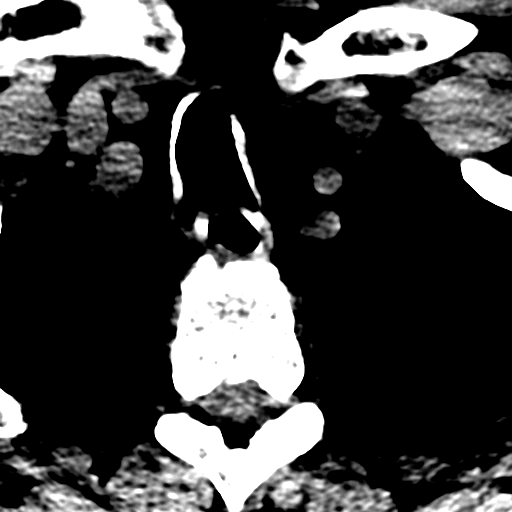
[im 7/93  bone]
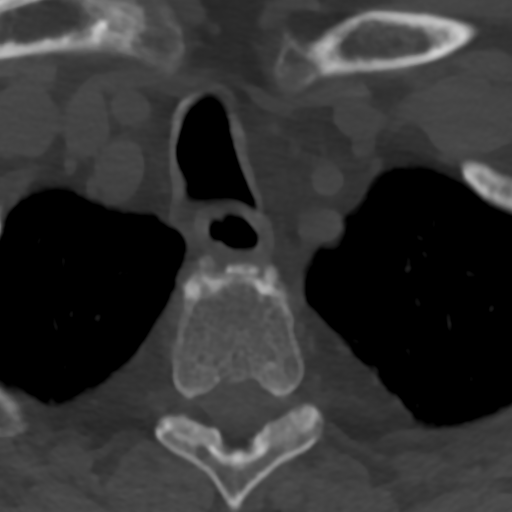
[im 20/93  brain]
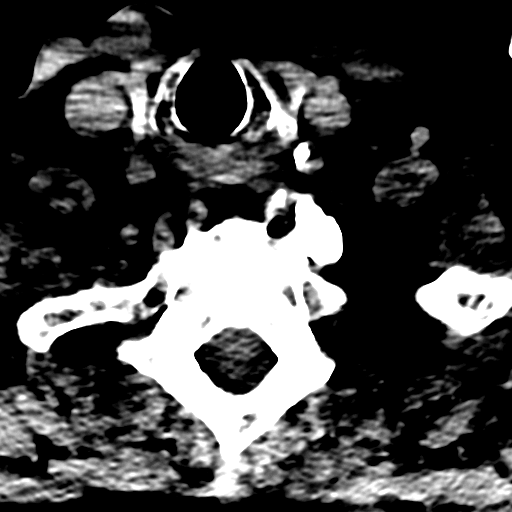
[im 33/93  brain]
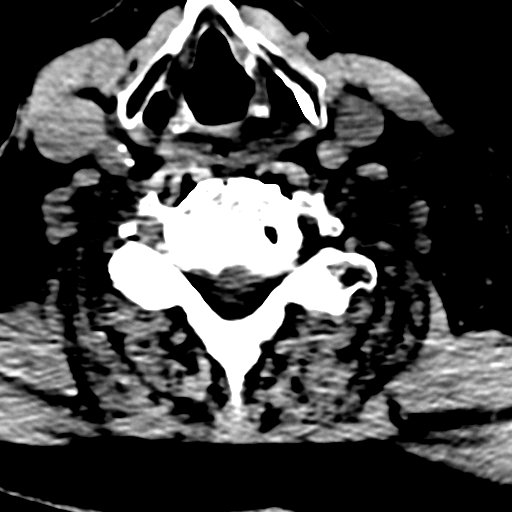
[im 40/93  brain]
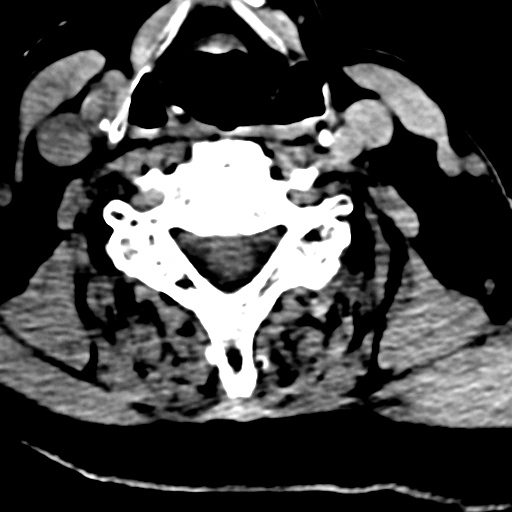
[im 53/93  brain]
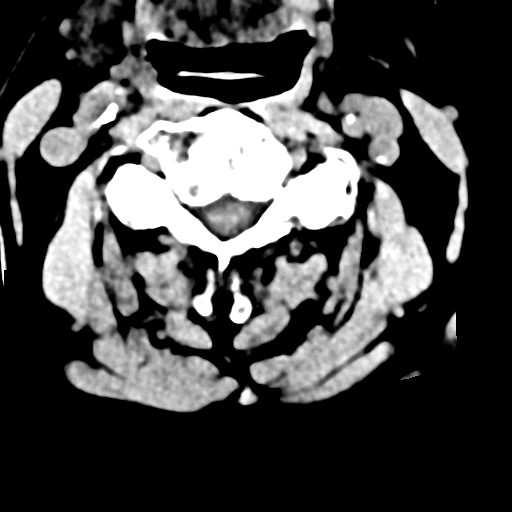
[im 53/93  bone]
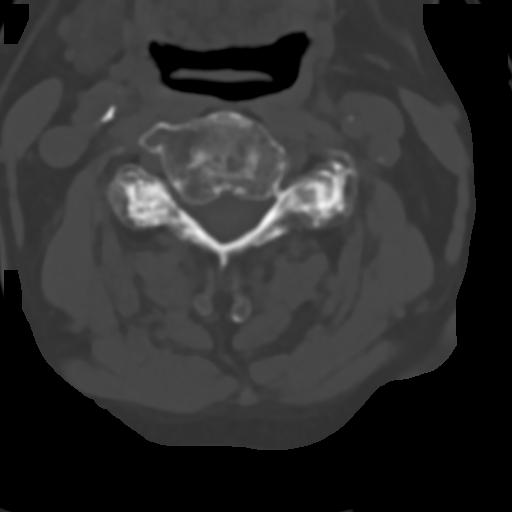
[im 60/93  brain]
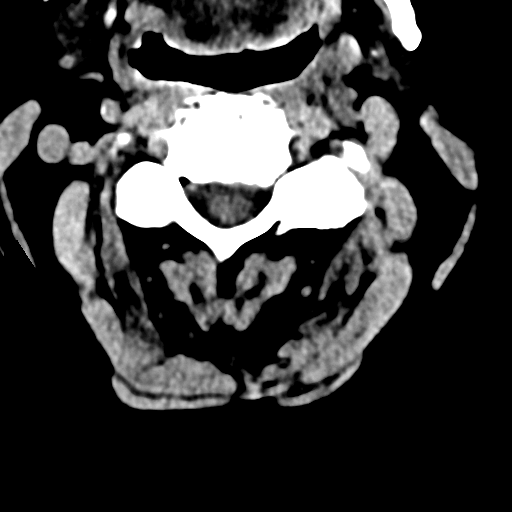
[im 73/93  brain]
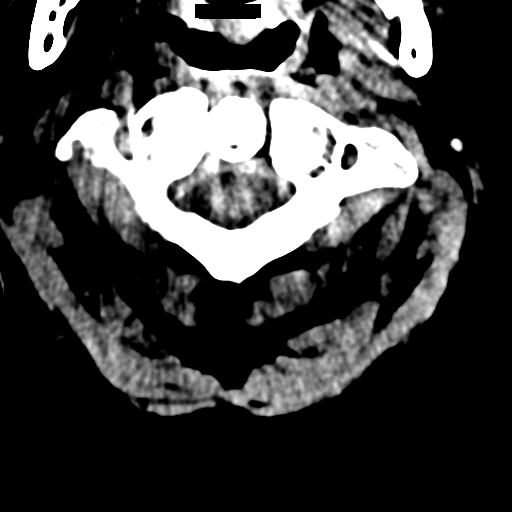
[im 86/93  brain]
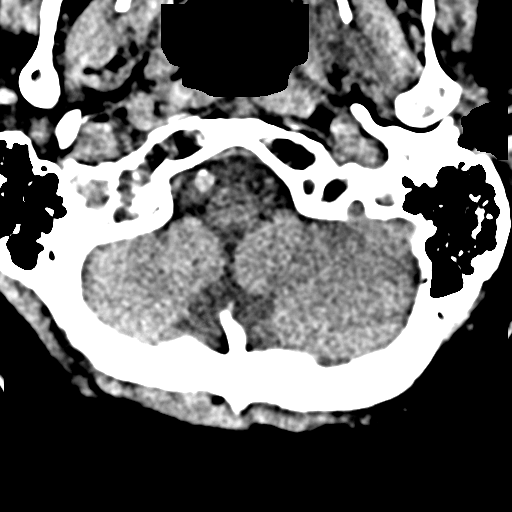

[Series 6: c_spine 2.0 sag bone · sagittal · 0.43mm/px · 3 of 61 slices shown]
[im 21/61  brain]
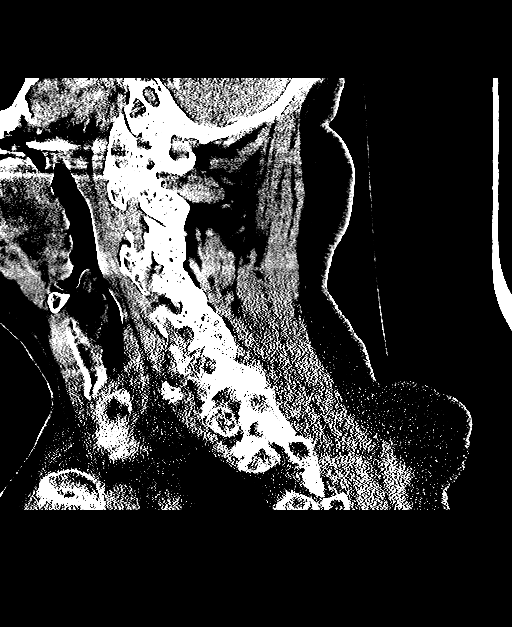
[im 31/61  brain]
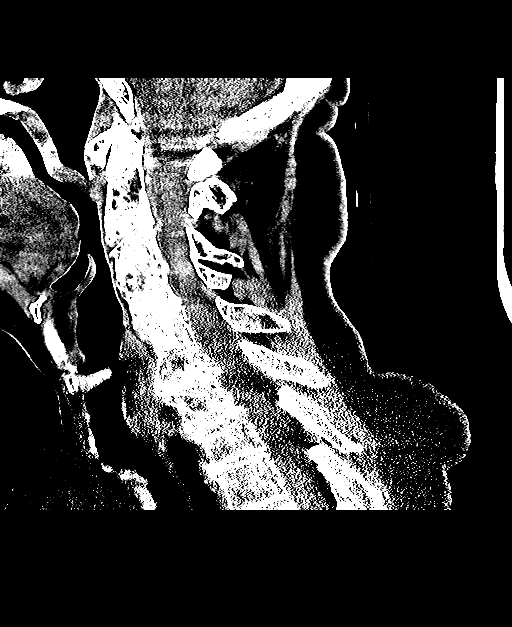
[im 41/61  brain]
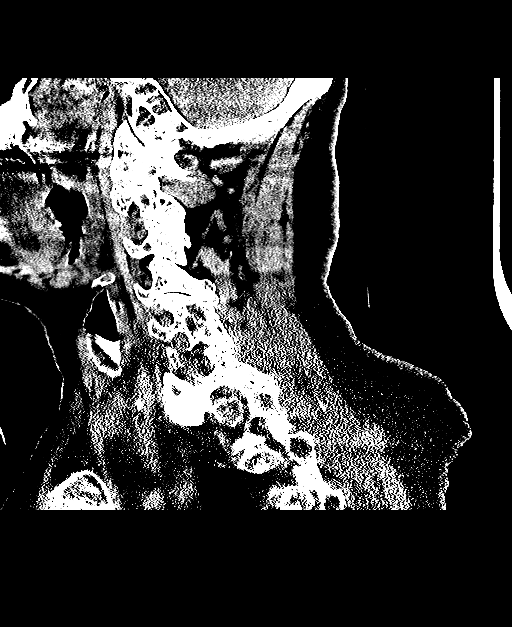

[Series 7: c_spine 2.0 cor bone · coronal · 0.32mm/px · 3 of 61 slices shown]
[im 21/61  brain]
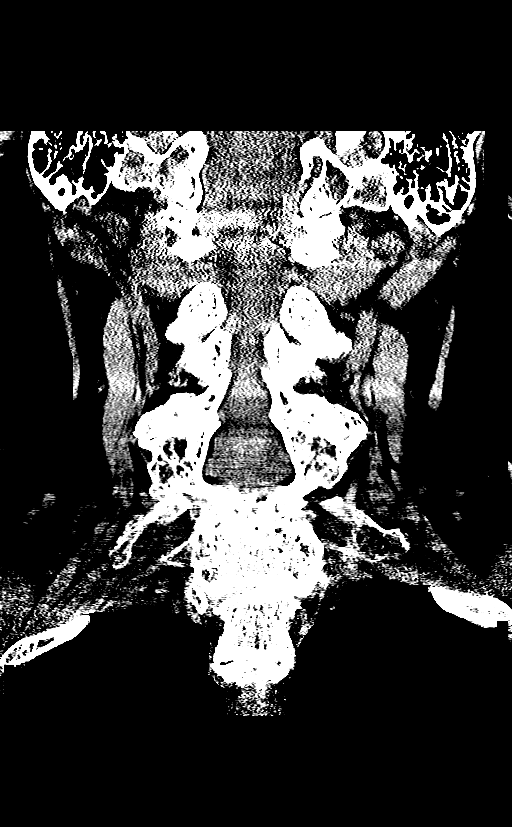
[im 27/61  brain]
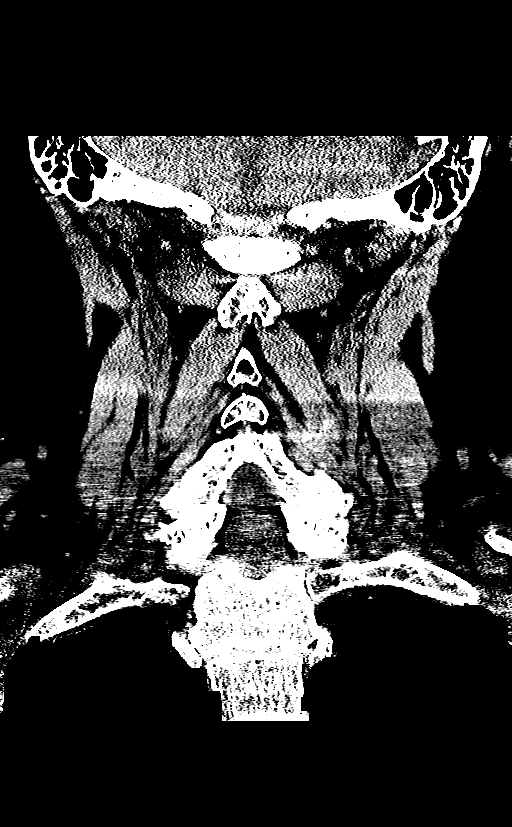
[im 34/61  brain]
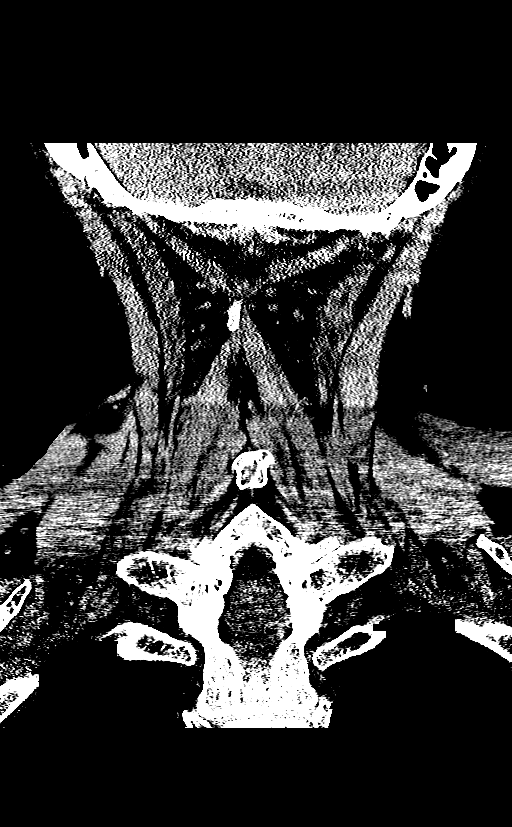

[14 of 47 positions shown; findings below may reference images not displayed]

FINDINGS: CT HEAD FINDINGS

Brain: Mild cerebral atrophy. Low-density in the periventricular
matter suggesting chronic changes. No evidence for acute hemorrhage,
mass lesion, midline shift, hydrocephalus or large infarct.

Vascular: No hyperdense vessel or unexpected calcification.

Skull: No calvarial fracture

Sinuses/Orbits: Deformity of the nasal septum with the apex towards
the left. This may be related to old injury. Mild mucosal disease in
the maxillary sinuses. Mastoids are clear.

Other: Mild swelling on the right side of the scalp.

CT CERVICAL SPINE FINDINGS

Alignment: Alignment is within normal limits.

Skull base and vertebrae: Negative for an acute fracture or
dislocation. Diffuse osteopenia.

Soft tissues and spinal canal: No prevertebral fluid or swelling. No
visible canal hematoma.

Disc levels: Disc space narrowing and C2-C3. Intervertebral body
fusion at C3-C4. Complete loss of the disc space with ankylosis at
C5-C6. Severe disc space narrowing at C6-C7. Multilevel degenerative
facet disease. Facet ankylosis bilaterally at C5-C6.

Upper chest: No pneumothorax.

Other: None
IMPRESSION: CT head:

No acute intracranial abnormality.

Atrophy with chronic small vessel ischemic changes.

CT cervical spine:

Multilevel degenerative disease as described.

No acute bone abnormality in cervical spine.

## 2017-05-17 IMAGING — CR DG FEMUR 2+V*R*
4 series · 4 of 4 positions shown · non-contrast
Comparison: None.

CLINICAL DATA: Status post fall.  Right hip pain.

EXAM:
RIGHT FEMUR 2 VIEWS

[femur ap (1 of 2)]
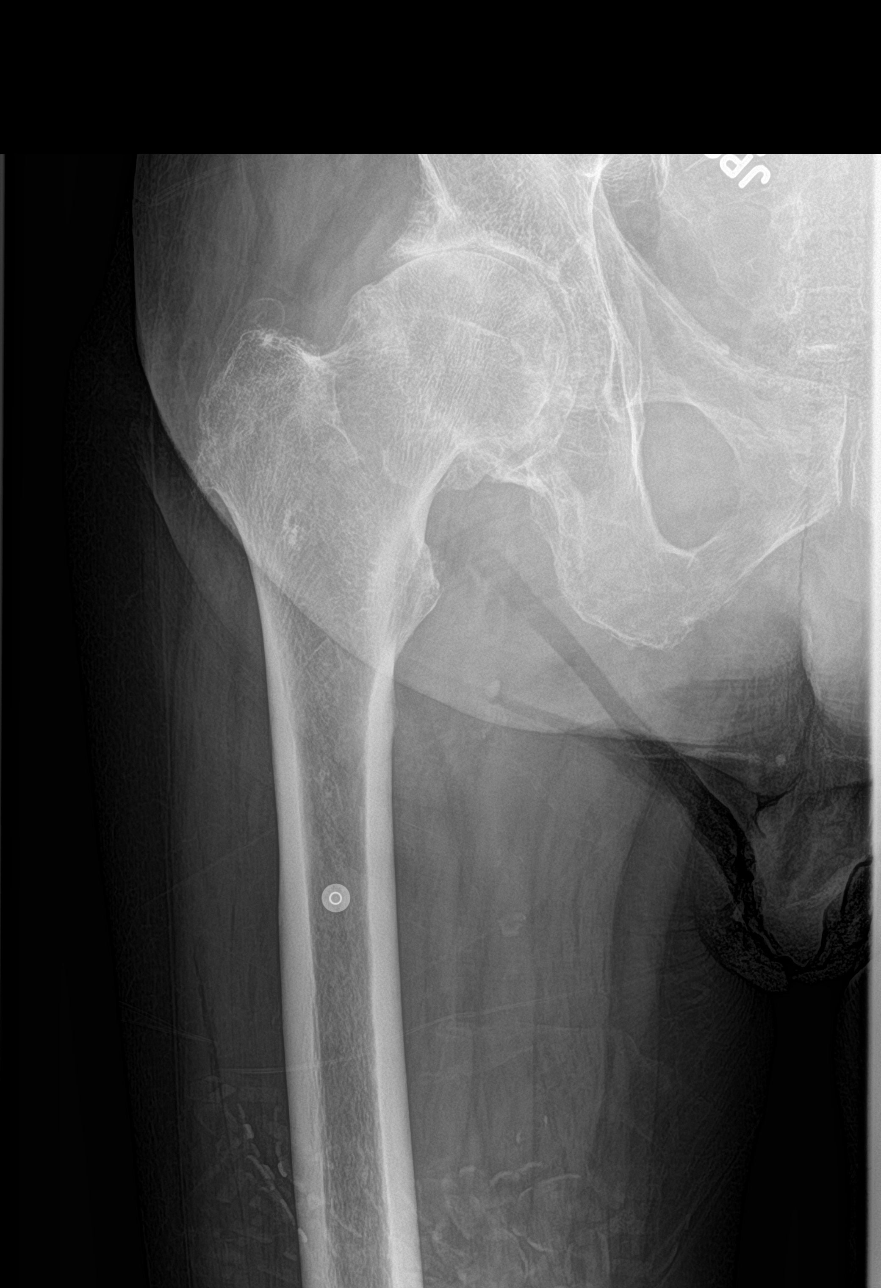

[femur ap (2 of 2)]
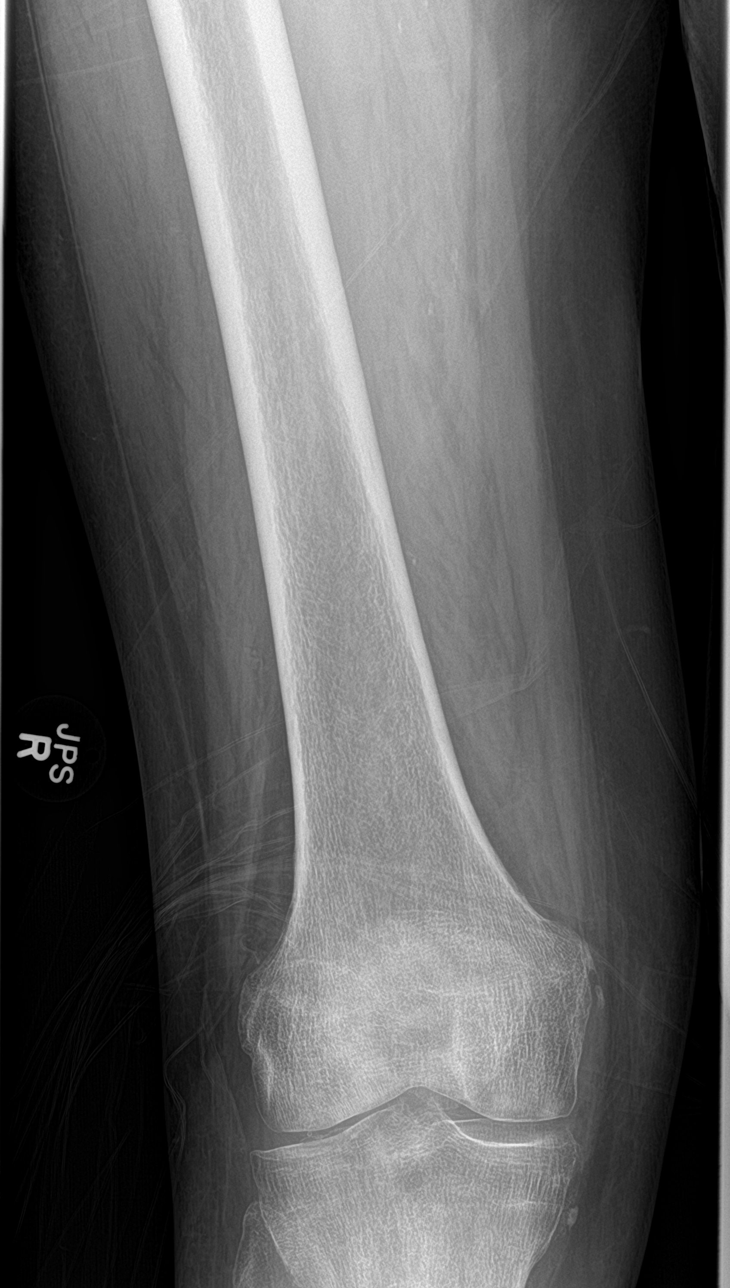

[femur lat]
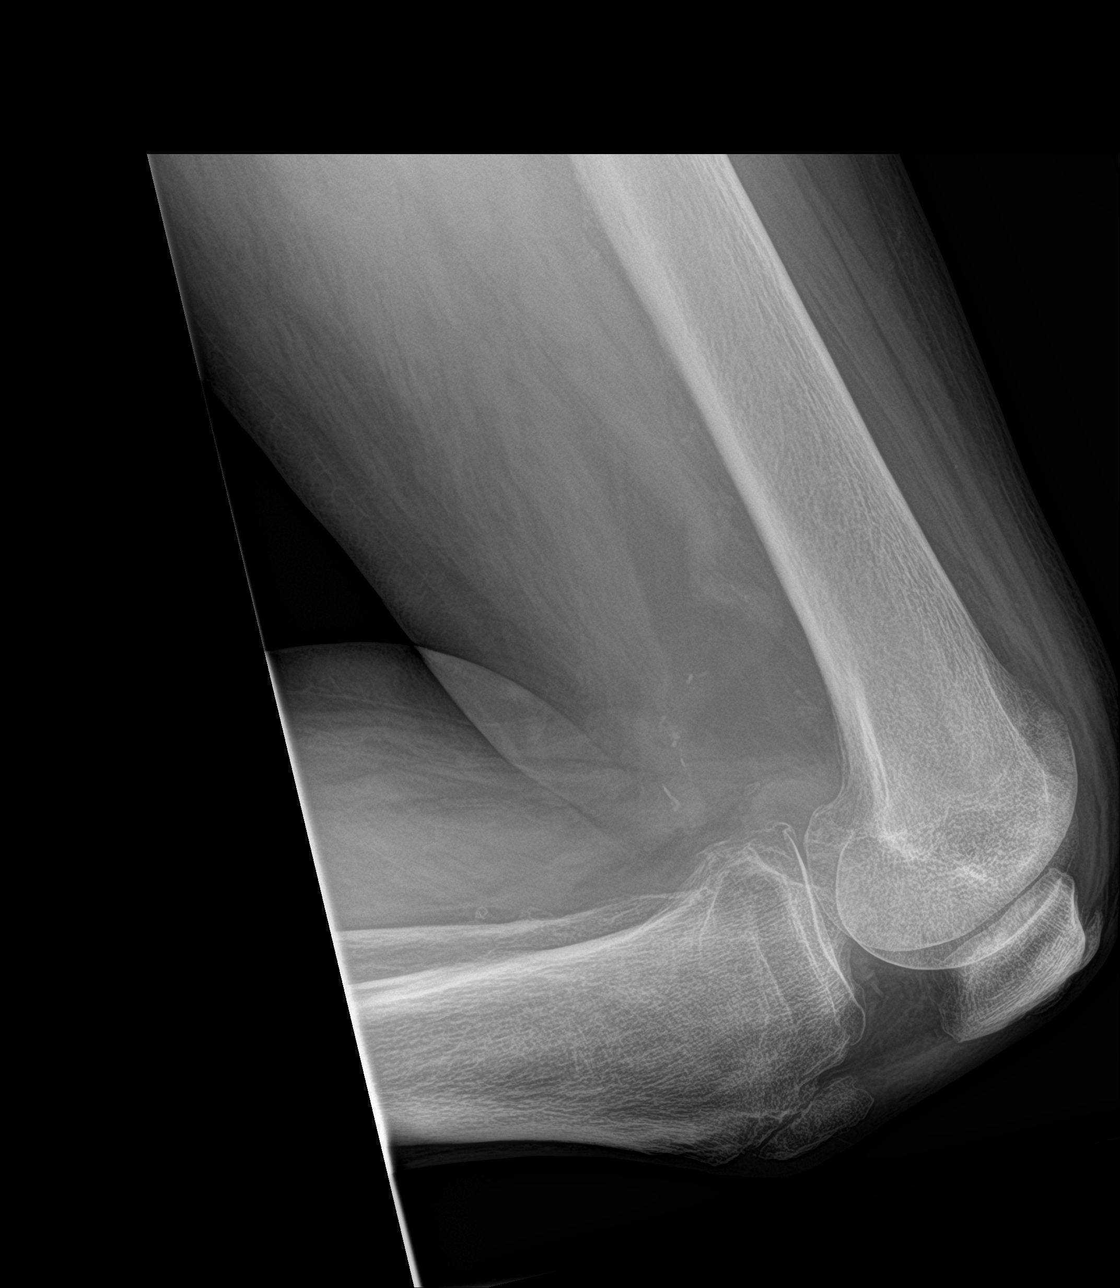

[hip x-table]
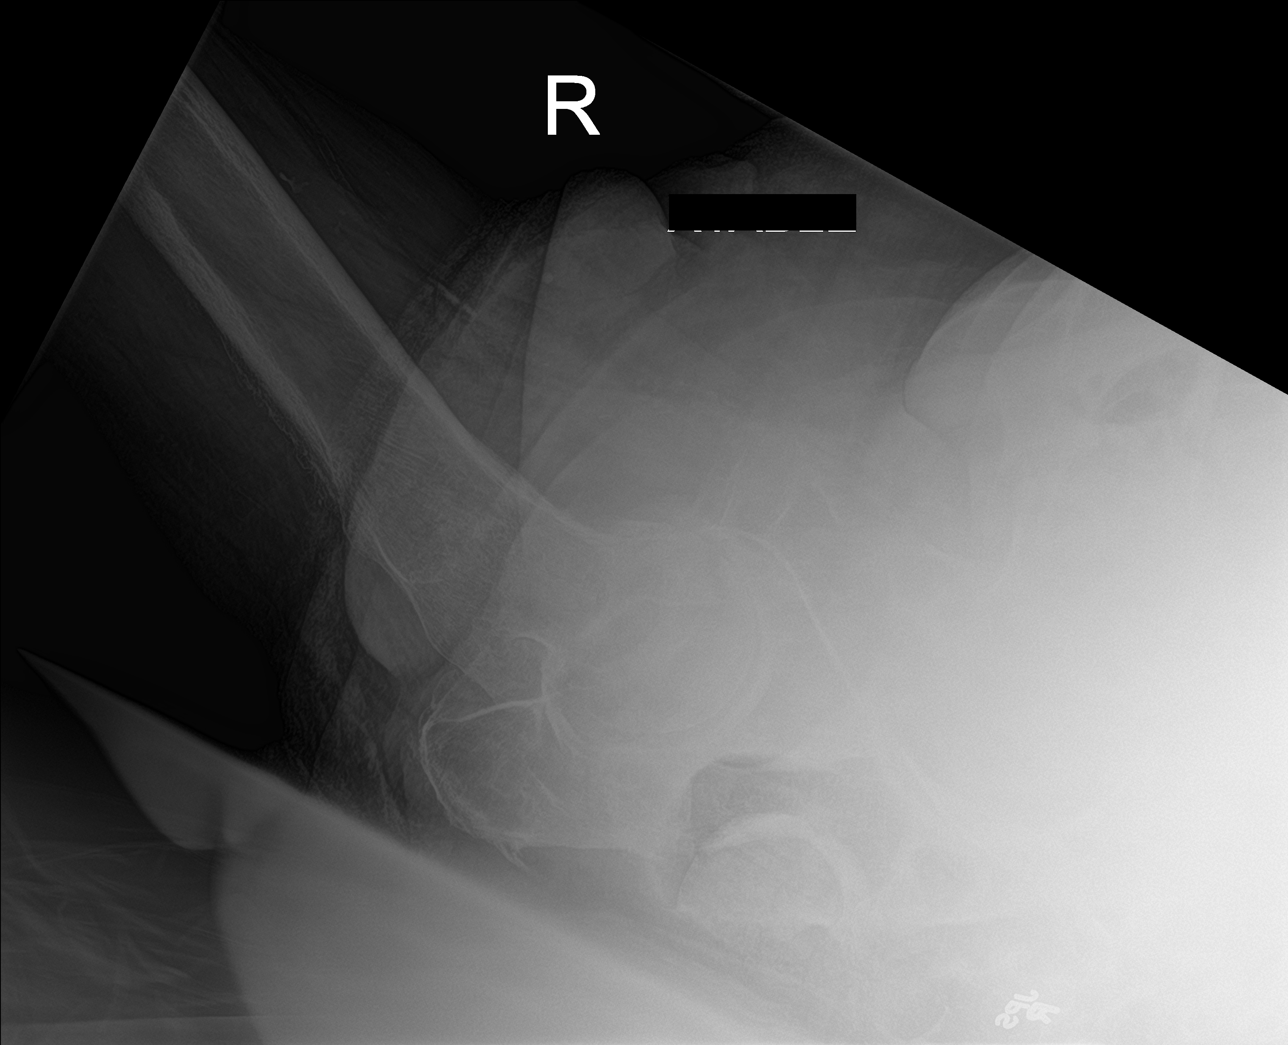

[4 of 4 positions shown; findings below may reference images not displayed]

FINDINGS: Generalized osteopenia. No fracture or dislocation. Severe
osteoarthritis of the right hip joint space narrowing, marginal
osteophytosis and subchondral sclerosis. No soft tissue abnormality.
IMPRESSION: No acute osseous injury of the right femur. Given the patient's age
and osteopenia, if there is persistent clinical concern for an
occult hip fracture, a MRI of the hip is recommended for increased
sensitivity.

## 2017-05-17 IMAGING — CR DG TIBIA/FIBULA 2V*R*
4 series · 4 of 4 positions shown · non-contrast
Comparison: None.

CLINICAL DATA: Status post fall.  Right leg pain.

EXAM:
RIGHT TIBIA AND FIBULA - 2 VIEW

[tibia ap (1 of 2)]
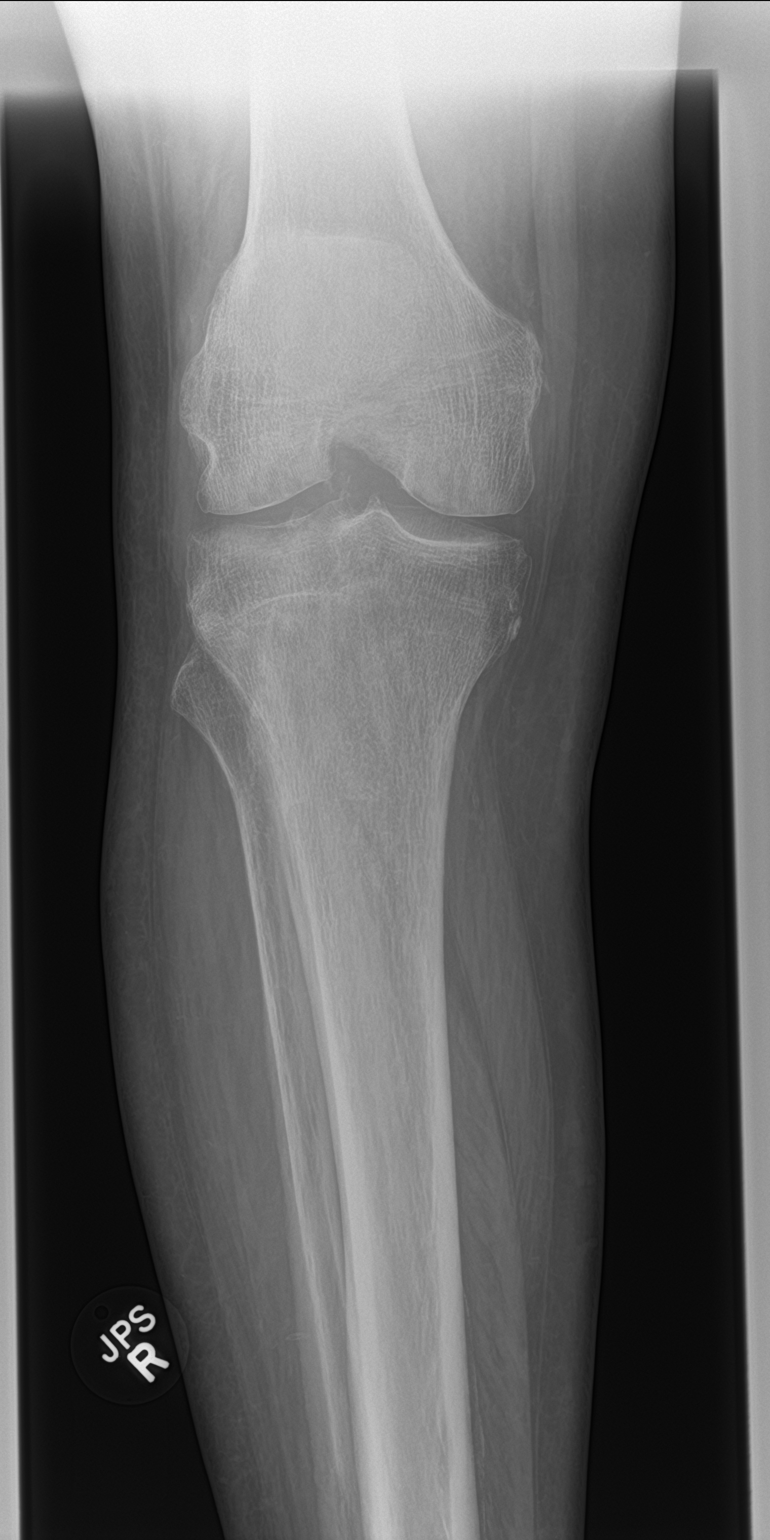

[tibia ap (2 of 2)]
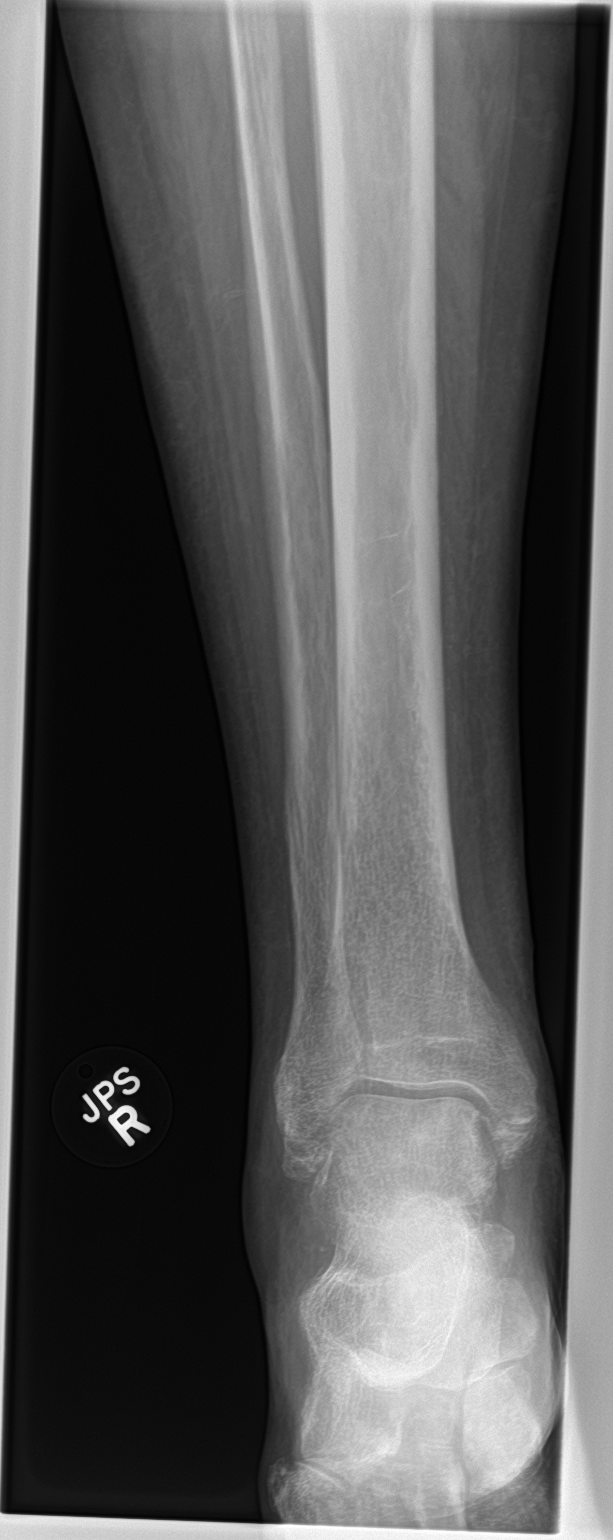

[tibia lat (1 of 2)]
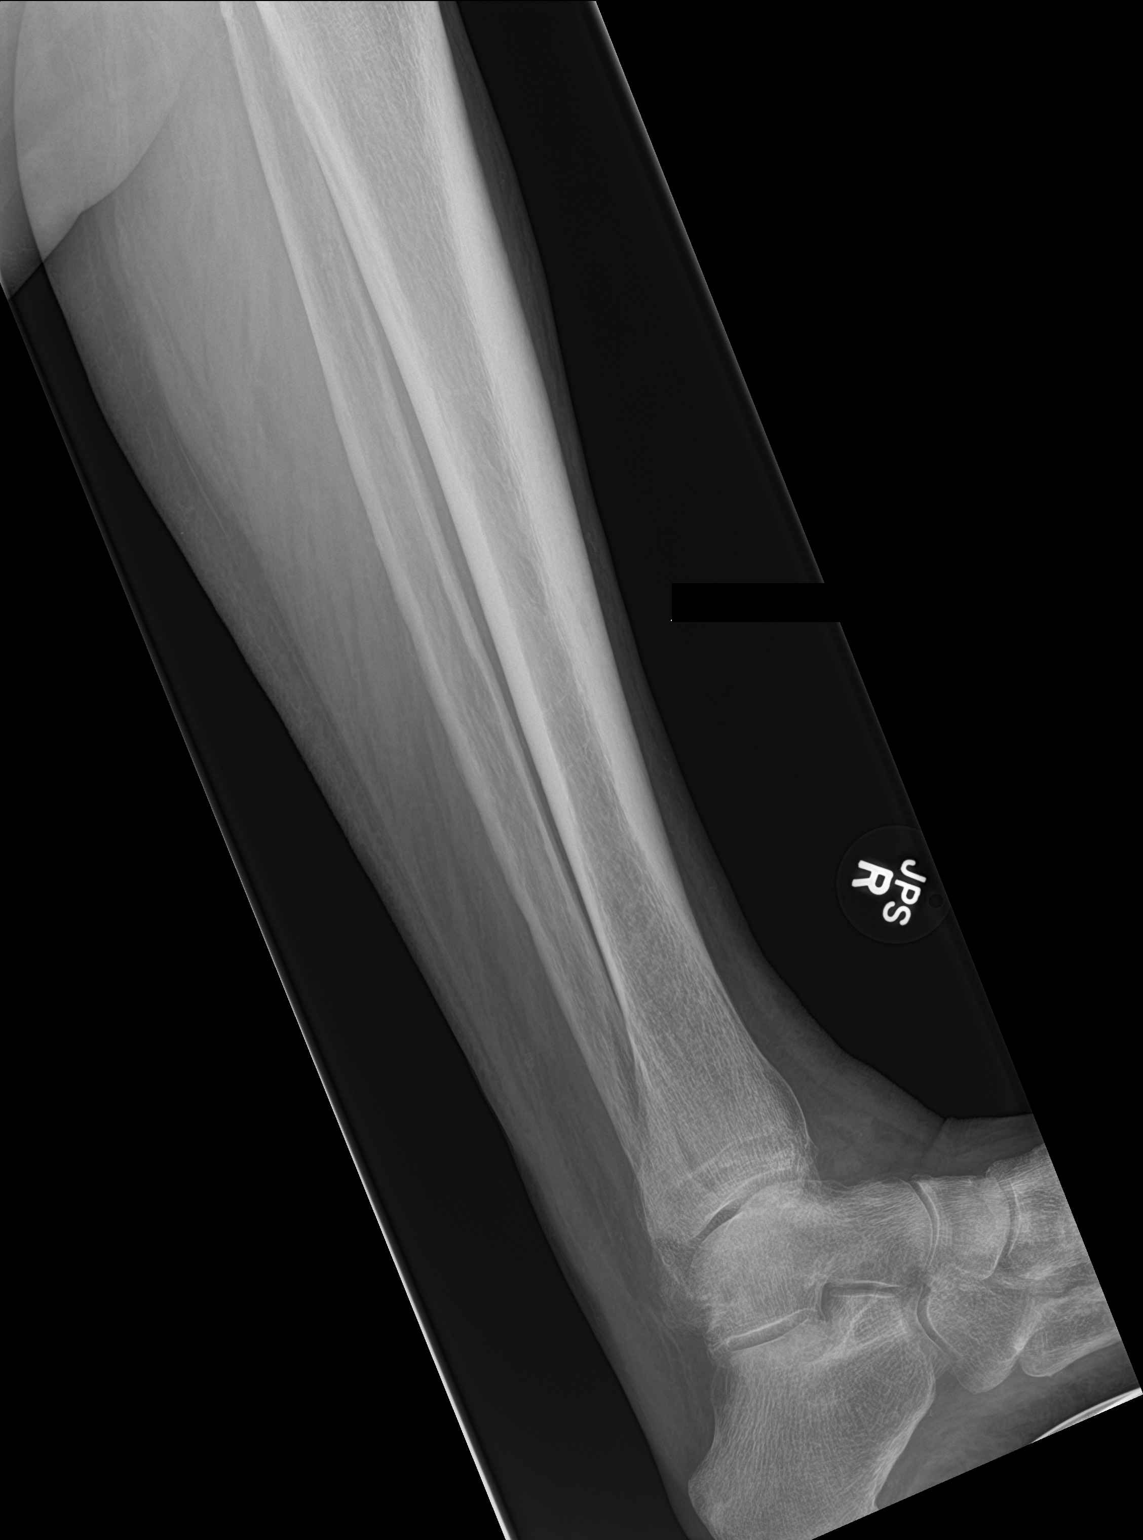

[tibia lat (2 of 2)]
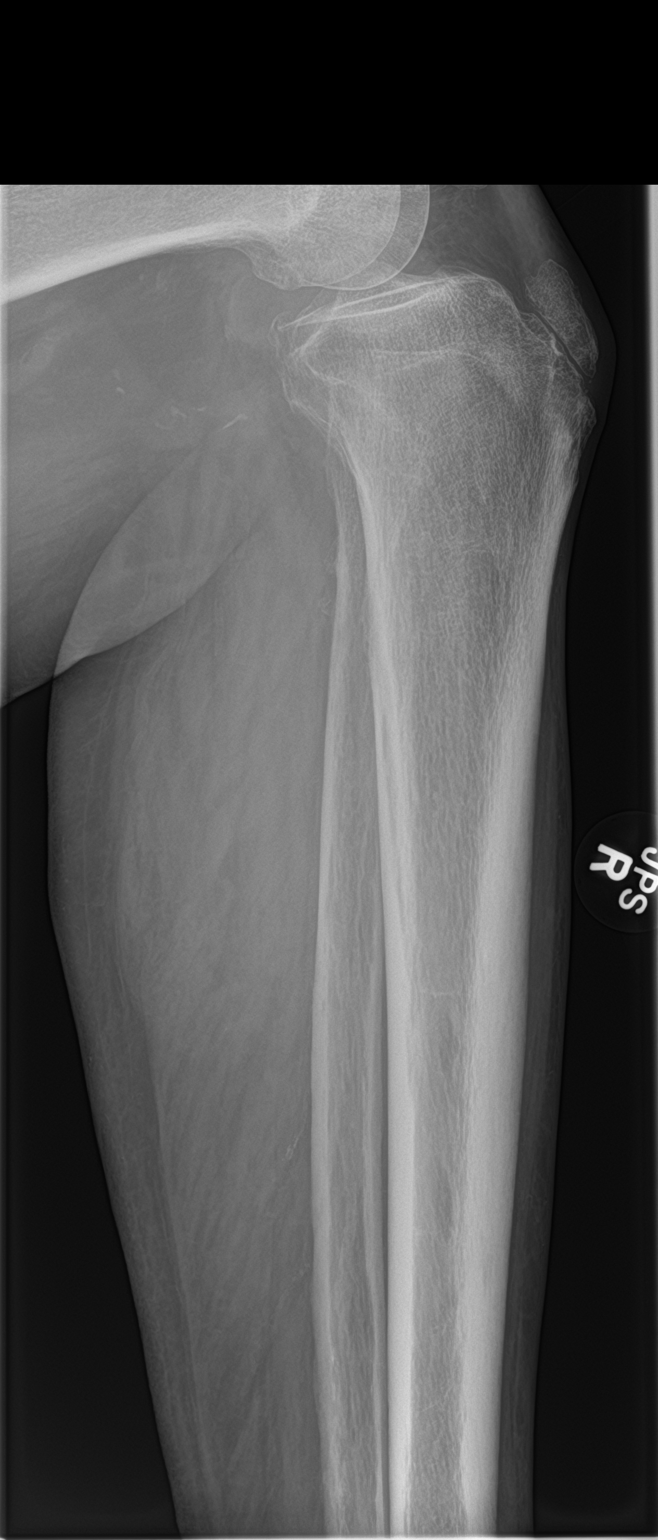

[4 of 4 positions shown; findings below may reference images not displayed]

FINDINGS: Generalized osteopenia. No acute fracture or dislocation. No
periosteal reaction or bone destruction. Peripheral vascular
atherosclerotic disease. No soft tissue abnormality.
IMPRESSION: No acute osseous injury of the right tibia or fibula..

## 2018-05-25 DEATH — deceased
